# Patient Record
Sex: Female | Born: 1985 | Race: White | Hispanic: No | Smoking: Current some day smoker
Health system: Southern US, Community
[De-identification: ages and names within clinical notes are randomized; demographics above are authoritative.]

## PROBLEM LIST (undated history)

## (undated) ENCOUNTER — Inpatient Hospital Stay (HOSPITAL_COMMUNITY): Payer: Self-pay

## (undated) DIAGNOSIS — Z87738 Personal history of other specified (corrected) congenital malformations of digestive system: Secondary | ICD-10-CM

## (undated) DIAGNOSIS — J45909 Unspecified asthma, uncomplicated: Secondary | ICD-10-CM

## (undated) DIAGNOSIS — T7840XA Allergy, unspecified, initial encounter: Secondary | ICD-10-CM

## (undated) DIAGNOSIS — D649 Anemia, unspecified: Secondary | ICD-10-CM

## (undated) HISTORY — PX: GASTROSCHISIS CLOSURE: SHX1700

## (undated) HISTORY — DX: Personal history of other specified (corrected) congenital malformations of digestive system: Z87.738

## (undated) HISTORY — DX: Unspecified asthma, uncomplicated: J45.909

---

## 2018-12-02 LAB — OB RESULTS CONSOLE PLATELET COUNT: Platelets: 221

## 2018-12-02 LAB — HIV ANTIBODY (ROUTINE TESTING W REFLEX)

## 2018-12-02 LAB — OB RESULTS CONSOLE RUBELLA ANTIBODY, IGM: Rubella: IMMUNE

## 2018-12-02 LAB — OB RESULTS CONSOLE RPR: RPR: NONREACTIVE

## 2018-12-02 LAB — OB RESULTS CONSOLE HGB/HCT, BLOOD
HCT: 35 (ref 29–41)
Hemoglobin: 12.1

## 2018-12-02 LAB — OB RESULTS CONSOLE HEPATITIS B SURFACE ANTIGEN: Hepatitis B Surface Ag: NEGATIVE

## 2018-12-29 NOTE — L&D Delivery Note (Signed)
OB/GYN Faculty Practice Delivery Note  Peggy [name] is a 33 y.o. [id number] s/p SVD at [redacted]w[redacted]d. She was admitted for spontaneous onset of labor.   ROM: 3h 92m with clear fluid GBS Status: negative Maximum Maternal Temperature: Temp (24hrs), Avg:98 F (36.7 C), Min:97.5 F (36.4 C), Max:98.5 F (36.9 C)  Labor Progress: . Epidural placement . AROM clear fluid  Delivery Date/Time: 05/23/19 at 2221 Delivery: Called to room and patient was complete and pushing with fetal deceleration noted on monitor. Prepared for delivery and pushed with patient with next contraction as FHR had improved to 130s. Head delivered ROA. No nuchal cord present. Shoulder and body delivered in usual fashion. Infant with spontaneous cry, placed on mother's abdomen, dried and stimulated. Cord clamped x 2 after 1-minute delay, and cut by grandmother of baby. Cord blood drawn. Placenta delivered spontaneously with gentle cord traction. Fundus firm with massage and Pitocin. Labia, perineum, vagina, and cervix inspected inspected with no lacerations.   Placenta: spontaneous, intact, 3-vessel cord (to be discarded) Complications: fetal deceleration with pushing Lacerations: none EBL: 206cc per triton Analgesia: epidural  Postpartum Planning [x]  message to sent to schedule follow-up  [x]  vaccines UTD  Infant: Vigorous female  APGARs 8, 9  2906g (6lb 6.5oz)  Laurel S. Earlene Plater, DO OB/GYN Fellow, Faculty Practice

## 2018-12-31 ENCOUNTER — Other Ambulatory Visit: Payer: Self-pay

## 2018-12-31 ENCOUNTER — Encounter (HOSPITAL_COMMUNITY): Payer: Self-pay | Admitting: *Deleted

## 2018-12-31 ENCOUNTER — Inpatient Hospital Stay (HOSPITAL_COMMUNITY)
Admission: AD | Admit: 2018-12-31 | Discharge: 2018-12-31 | Disposition: A | Discharge status: Home / Self Care | Payer: Medicaid Other | Attending: Obstetrics & Gynecology | Admitting: Obstetrics & Gynecology

## 2018-12-31 DIAGNOSIS — Z0371 Encounter for suspected problem with amniotic cavity and membrane ruled out: Secondary | ICD-10-CM | POA: Diagnosis not present

## 2018-12-31 DIAGNOSIS — O09212 Supervision of pregnancy with history of pre-term labor, second trimester: Secondary | ICD-10-CM | POA: Diagnosis not present

## 2018-12-31 DIAGNOSIS — Z3A19 19 weeks gestation of pregnancy: Secondary | ICD-10-CM

## 2018-12-31 DIAGNOSIS — O09892 Supervision of other high risk pregnancies, second trimester: Secondary | ICD-10-CM

## 2018-12-31 DIAGNOSIS — R109 Unspecified abdominal pain: Secondary | ICD-10-CM

## 2018-12-31 DIAGNOSIS — O26892 Other specified pregnancy related conditions, second trimester: Secondary | ICD-10-CM | POA: Diagnosis not present

## 2018-12-31 DIAGNOSIS — Z87891 Personal history of nicotine dependence: Secondary | ICD-10-CM | POA: Insufficient documentation

## 2018-12-31 HISTORY — DX: Anemia, unspecified: D64.9

## 2018-12-31 LAB — AMNISURE RUPTURE OF MEMBRANE (ROM) NOT AT ARMC: Amnisure ROM: NEGATIVE

## 2018-12-31 MED ORDER — CETIRIZINE HCL 10 MG PO TABS: 10.0000 mg | ORAL_TABLET | Freq: Every day | ORAL | 5 refills | Status: DC

## 2018-12-31 MED ORDER — PRENATAL VITAMINS 0.8 MG PO TABS: 1.0000 | ORAL_TABLET | Freq: Every day | ORAL | 12 refills | Status: DC

## 2018-12-31 NOTE — MAU Provider Note (Addendum)
Chief Complaint:  Vaginal Discharge   First Provider Initiated Contact with Patient 12/31/18 1602      HPI: Peggy [name] is a 33 y.o. [phone] at [redacted]w[redacted]d who presents to maternity admissions reporting onset of leaking fluid this morning wetting 3 pairs of underwear.  She put on a pad after the leaking episodes but reports the pad has stayed dry. There is some mild bilateral intermittent cramping in her low abdomen associated with the leaking. There are no other symptoms. She has not tried any treatments.  She is feeling fetal movement.  HPI  Past Medical History: Past Medical History:  Diagnosis Date  . Anemia     Past obstetric history: OB History  Gravida Para Term Preterm AB Living  4 3   3   3   SAB TAB Ectopic Multiple Live Births          3    # Outcome Date GA Lbr Len/2nd Weight Sex Delivery Anes PTL Lv  4 Current           3 Preterm 09/25/17 [redacted]w[redacted]d       LIV  2 Preterm 02/28/12 [redacted]w[redacted]d   M    LIV  1 Preterm 11/27/05 [redacted]w[redacted]d   M Vag-Spont   LIV    Past Surgical History: The histories are not reviewed yet. Please review them in the "History" navigator section and refresh this SmartLink.  Family History: History reviewed. No pertinent family history.  Social History: Social History   Tobacco Use  . Smoking status: Former Smoker    Last attempt to quit: 08/31/2018    Years since quitting: 0.3  Substance Use Topics  . Alcohol use: Not Currently  . Drug use: Not Currently    Allergies: No Known Allergies  Meds:  No medications prior to admission.    ROS:  Review of Systems  Constitutional: Negative for chills, fatigue and fever.  Eyes: Negative for visual disturbance.  Respiratory: Negative for shortness of breath.   Cardiovascular: Negative for chest pain.  Gastrointestinal: Positive for abdominal pain. Negative for nausea and vomiting.  Genitourinary: Positive for pelvic pain and vaginal discharge. Negative for difficulty urinating, dysuria, flank pain, vaginal  bleeding and vaginal pain.  Neurological: Negative for dizziness and headaches.  Psychiatric/Behavioral: Negative.      I have reviewed patient's Past Medical Hx, Surgical Hx, Family Hx, Social Hx, medications and allergies.   Physical Exam   Patient Vitals for the past 24 hrs:  BP Temp Temp src Pulse Resp SpO2 Weight  12/31/18 1443 112/62 98.3 F (36.8 C) Oral 71 17 100 % 52.2 kg   Constitutional: Well-developed, well-nourished female in no acute distress.  Cardiovascular: normal rate Respiratory: normal effort GI: Abd soft, non-tender, gravid appropriate for gestational age.  MS: Extremities nontender, no edema, normal ROM Neurologic: Alert and oriented x 4.  GU: Neg CVAT.  PELVIC EXAM: Cervix pink, visually closed, without lesion, scant white creamy discharge, no pooling of fluid with valsalva, vaginal walls and external genitalia normal   Dilation: Closed Effacement (%): 50 Cervical Position: Posterior Station: Ballotable Exam by:: Leftwich-Kirby, CNM  FHT:  148 by doppler   Labs: Results for orders placed or performed during the hospital encounter of 12/31/18 (from the past 24 hour(s))  Amnisure rupture of membrane (rom)not at [facility]     Status: None   Collection Time: 12/31/18  3:58 PM  Result Value Ref Range   Amnisure ROM NEGATIVE       Imaging:  No results  found.  MAU Course/MDM: No pooling on SSE, amnisure pending Cervix 0/50/high on exam so no evidence of labor FHR wnl Pt transferring from [place], lives in [place].  Has appt at [facility] but wants to delivery at [facility] for high level NICU with her hx of preterm delivery x 3. Amnisure negative so no evidence of ROM Message sent to [facility] to establish care Outpatient anatomy US ordered Rx for PNV and Zyrtec Return to MAU as needed for emergencies Pt discharge with strict return precautions.   Assessment: 1. Encounter for suspected premature rupture of amniotic membranes, with rupture of  membranes not found   2. Abdominal pain during pregnancy, second trimester   3. [redacted] weeks gestation of pregnancy   4. History of preterm delivery, currently pregnant in second trimester     Plan: Discharge home Labor precautions and fetal kick counts Follow-up Information    Center for [facility] at [facility] Follow up.   Specialty:  Obstetrics and Gynecology Contact information: [address] [place] [address] [phone]         Allergies as of 12/31/2018   No Known Allergies     Medication List    TAKE these medications   acetaminophen 325 MG tablet Commonly known as:  TYLENOL Take 650 mg by mouth every 6 (six) hours as needed for moderate pain or headache.   albuterol 108 (90 Base) MCG/ACT inhaler Commonly known as:  PROVENTIL HFA;VENTOLIN HFA Inhale 1-2 puffs into the lungs every 6 (six) hours as needed for wheezing or shortness of breath.   cetirizine 10 MG tablet Commonly known as:  ZYRTEC Take 10 mg by mouth daily. What changed:  Another medication with the same name was added. Make sure you understand how and when to take each.   cetirizine 10 MG tablet Commonly known as:  ZYRTEC Take 1 tablet (10 mg total) by mouth daily. What changed:  You were already taking a medication with the same name, and this prescription was added. Make sure you understand how and when to take each.   Prenatal Vitamins 0.8 MG tablet Take 1 tablet by mouth daily.       Sharen Counter Certified Nurse-Midwife 12/31/2018 5:36 PM

## 2018-12-31 NOTE — MAU Note (Signed)
Been high risk with all her preg, all preterm deliveries.  Been leaking all morning, transferring from [place], last appt was 11/6. changed her underwear 3 times this morning.  Pain in RLQ, started around 0500.

## 2019-01-06 ENCOUNTER — Encounter: Payer: Self-pay | Admitting: Obstetrics and Gynecology

## 2019-01-06 ENCOUNTER — Encounter: Payer: Self-pay | Admitting: *Deleted

## 2019-01-06 ENCOUNTER — Ambulatory Visit (INDEPENDENT_AMBULATORY_CARE_PROVIDER_SITE_OTHER): Payer: Medicaid Other | Admitting: Obstetrics and Gynecology

## 2019-01-06 ENCOUNTER — Other Ambulatory Visit (HOSPITAL_COMMUNITY)
Admission: RE | Admit: 2019-01-06 | Discharge: 2019-01-06 | Disposition: A | Discharge status: Home / Self Care | Payer: Medicaid Other | Source: Ambulatory Visit | Attending: Obstetrics and Gynecology | Admitting: Obstetrics and Gynecology

## 2019-01-06 ENCOUNTER — Encounter: Payer: Self-pay | Admitting: Radiology

## 2019-01-06 VITALS — BP 99/65 | HR 82 | Ht 59.0 in | Wt 119.0 lb

## 2019-01-06 DIAGNOSIS — O09219 Supervision of pregnancy with history of pre-term labor, unspecified trimester: Principal | ICD-10-CM

## 2019-01-06 DIAGNOSIS — O09212 Supervision of pregnancy with history of pre-term labor, second trimester: Secondary | ICD-10-CM

## 2019-01-06 DIAGNOSIS — Z3A19 19 weeks gestation of pregnancy: Secondary | ICD-10-CM

## 2019-01-06 DIAGNOSIS — O0992 Supervision of high risk pregnancy, unspecified, second trimester: Secondary | ICD-10-CM | POA: Diagnosis not present

## 2019-01-06 DIAGNOSIS — Z87761 Personal history of (corrected) gastroschisis: Secondary | ICD-10-CM

## 2019-01-06 DIAGNOSIS — O0990 Supervision of high risk pregnancy, unspecified, unspecified trimester: Secondary | ICD-10-CM | POA: Insufficient documentation

## 2019-01-06 DIAGNOSIS — O09899 Supervision of other high risk pregnancies, unspecified trimester: Secondary | ICD-10-CM

## 2019-01-06 DIAGNOSIS — Z3482 Encounter for supervision of other normal pregnancy, second trimester: Secondary | ICD-10-CM | POA: Diagnosis not present

## 2019-01-06 DIAGNOSIS — Z87738 Personal history of other specified (corrected) congenital malformations of digestive system: Secondary | ICD-10-CM | POA: Diagnosis not present

## 2019-01-06 HISTORY — DX: Personal history of other specified (corrected) congenital malformations of digestive system: Z87.738

## 2019-01-06 HISTORY — DX: Personal history of (corrected) gastroschisis: Z87.761

## 2019-01-06 MED ORDER — HYDROXYPROGESTERONE CAPROATE 275 MG/1.1ML ~~LOC~~ SOAJ
275.0000 mg | Freq: Once | SUBCUTANEOUS | Status: AC
  Administered 2019-01-06: 275 mg via SUBCUTANEOUS

## 2019-01-06 NOTE — Progress Notes (Signed)
New OB Note  01/06/2019   Clinic: Center for [facility]  Chief Complaint: transfer of care Peggy [name], Peggy [name])  History of Present Illness: Peggy [name] is a 33 y.o. [phone] @ 19/3 weeks (EDC 6/1 per patient report). Patient states first u/s was at 6wks.  Patient's last menstrual period was 09/02/2018.) but not certain this is correct Preg complicated by has Supervision of high risk pregnancy, antepartum; History of preterm delivery, currently pregnant; and History of gastroschisis on their problem list.   Last visit with them was on 12/5  She was using oral contraceptives (estrogen/progesterone) when she conceived.  She has Negative signs or symptoms of miscarriage or preterm labor  ROS: A 12-point review of systems was performed and negative, except as stated in the above HPI.  OBGYN History: As per HPI. OB History  Gravida Para Term Preterm AB Living  4 3   3   3   SAB TAB Ectopic Multiple Live Births          3    # Outcome Date GA Lbr Len/2nd Weight Sex Delivery Anes PTL Lv  4 Current           3 Preterm 09/25/17 [redacted]w[redacted]d  5 lb 8 oz (2.495 kg)     LIV  2 Preterm 02/28/12 [redacted]w[redacted]d  6 lb 14 oz (3.118 kg) M    LIV  1 Preterm 11/27/05 [redacted]w[redacted]d   M Vag-Spont   LIV    Any issues with any prior pregnancies: PTB x 3. Was on 17p for last two. Patient states all were spontaneous PTL Prior children are healthy, doing well, and without any problems or issues: none for last two History of pap smears: Yes. Last pap smear 2019 and results were negative per patient report   Past Medical History: Past Medical History:  Diagnosis Date  . Anemia   . History of gastroschisis 01/06/2019    Past Surgical History: Past Surgical History:  Procedure Laterality Date  . GASTROSCHISIS CLOSURE      Family History:  History reviewed. No pertinent family history.   Social History:  Social History   Socioeconomic History  . Marital status: Widowed    Spouse name: Not on file  . Number of  children: Not on file  . Years of education: Not on file  . Highest education level: Not on file  Occupational History  . Not on file  Social Needs  . Financial resource strain: Not on file  . Food insecurity:    Worry: Not on file    Inability: Not on file  . Transportation needs:    Medical: Not on file    Non-medical: Not on file  Tobacco Use  . Smoking status: Former Smoker    Last attempt to quit: 08/31/2018    Years since quitting: 0.3  . Smokeless tobacco: Never Used  Substance and Sexual Activity  . Alcohol use: Not Currently  . Drug use: Not Currently  . Sexual activity: Not Currently  Lifestyle  . Physical activity:    Days per week: Not on file    Minutes per session: Not on file  . Stress: Not on file  Relationships  . Social connections:    Talks on phone: Not on file    Gets together: Not on file    Attends religious service: Not on file    Active member of club or organization: Not on file    Attends meetings of clubs or organizations: Not on file  Relationship status: Not on file  . Intimate partner violence:    Fear of current or ex partner: Not on file    Emotionally abused: Not on file    Physically abused: Not on file    Forced sexual activity: Not on file  Other Topics Concern  . Not on file  Social History Narrative  . Not on file     Allergy: No Known Allergies  Health Maintenance:  Mammogram Up to Date: not applicable  Current Outpatient Medications: PNV  Physical Exam:   BP 99/65   Pulse 82   Ht 4\' 11"  (1.499 m)   Wt 119 lb (54 kg)   LMP 09/02/2018   BMI 24.04 kg/m  Body mass index is 24.04 kg/m. Contractions: Irritability Vag. Bleeding: None. Fundal height: 20 FHTs: 140s  General appearance: Well nourished, well developed female in no acute distress.  Neck:  Supple, normal appearance, and no thyromegaly  Cardiovascular: S1, S2 normal, no murmur, rub or gallop, regular rate and rhythm Respiratory:  Clear to  auscultation bilateral. Normal respiratory effort Abdomen: positive bowel sounds and no masses, hernias; diffusely non tender to palpation, non distended. Gravid. Well healed upper abdomen transverse scar Neuro/Psych:  Normal mood and affect.  Skin:  Warm and dry.  Lymphatic:  No inguinal lymphadenopathy.   Pelvic exam: is not limited by body habitus EGBUS: within normal limits, Vagina: within normal limits and with no blood in the vault, Cervix: normal appearing cervix without discharge or lesions, closed/long/high, Uterus:  enlarged, c/w 20 week size, and Adnexa:  normal adnexa and no mass, fullness, tenderness  Laboratory: Awaiting records. Patient pulled up her portal and showed us some.  Imaging:  none  Assessment: pt stable  Plan: 1. Supervision of high risk pregnancy, antepartum Routine care. Set up for asap anatomy u/s. Will get rest of prenatal labs which she states she has with them back to 2013. Pt amenable to genetics. Follow up if records received at [facility]. Confirm edc once records received. Offer carrier screen if not already done from prior pregnancies.  - US MFM OB Transvaginal; Future - Cervicovaginal ancillary only( [place])  2. History of preterm delivery, currently pregnant Amenable to start 17p today. Will do cx length with anatomy u/s. Exam negative today.  - US MFM OB Transvaginal; Future  Problem list reviewed and updated.  Follow up in 1 weeks.  >50% of 25 min visit spent on counseling and coordination of care.     Cornelia Copaharlie Angalena Cousineau, Jr. MD Attending [facility](Faculty Practice)

## 2019-01-06 NOTE — Progress Notes (Signed)
Last pap October 18, 2018 was normal   Last prenatal visit was 12/5

## 2019-01-07 LAB — CERVICOVAGINAL ANCILLARY ONLY
Chlamydia: NEGATIVE
Neisseria Gonorrhea: NEGATIVE
Trichomonas: NEGATIVE

## 2019-01-07 LAB — ABO AND RH: Rh Factor: POSITIVE

## 2019-01-07 LAB — ANTIBODY SCREEN: Antibody Screen: NEGATIVE

## 2019-01-07 LAB — HEPATITIS C ANTIBODY: Hep C Virus Ab: <0.1 s/co ratio (ref 0.0–0.9)

## 2019-01-07 LAB — HIV ANTIBODY (ROUTINE TESTING W REFLEX): HIV Screen 4th Generation wRfx: NONREACTIVE

## 2019-01-08 LAB — AFP, SERUM, OPEN SPINA BIFIDA
AFP MoM: 0.76
AFP Value: 45.5 ng/mL
Gest. Age on Collection Date: 19.3 weeks
Maternal Age At EDD: 33.3 yr
OSBR Risk 1 IN: 10000
Test Results:: NEGATIVE
Weight: 119 [lb_av]

## 2019-01-11 ENCOUNTER — Encounter: Payer: Medicaid Other | Admitting: Obstetrics and Gynecology

## 2019-01-11 ENCOUNTER — Encounter: Payer: Self-pay | Admitting: Radiology

## 2019-01-12 ENCOUNTER — Encounter (HOSPITAL_COMMUNITY): Payer: Self-pay

## 2019-01-12 ENCOUNTER — Ambulatory Visit (HOSPITAL_COMMUNITY)
Admission: RE | Admit: 2019-01-12 | Discharge: 2019-01-12 | Disposition: A | Discharge status: Home / Self Care | Payer: Medicaid Other | Source: Ambulatory Visit | Attending: Advanced Practice Midwife | Admitting: Advanced Practice Midwife

## 2019-01-12 DIAGNOSIS — Z3A19 19 weeks gestation of pregnancy: Secondary | ICD-10-CM

## 2019-01-12 DIAGNOSIS — O09212 Supervision of pregnancy with history of pre-term labor, second trimester: Secondary | ICD-10-CM | POA: Diagnosis not present

## 2019-01-12 DIAGNOSIS — O09899 Supervision of other high risk pregnancies, unspecified trimester: Secondary | ICD-10-CM

## 2019-01-12 DIAGNOSIS — Z363 Encounter for antenatal screening for malformations: Secondary | ICD-10-CM | POA: Diagnosis not present

## 2019-01-12 DIAGNOSIS — O0990 Supervision of high risk pregnancy, unspecified, unspecified trimester: Secondary | ICD-10-CM | POA: Diagnosis not present

## 2019-01-12 DIAGNOSIS — O09892 Supervision of other high risk pregnancies, second trimester: Secondary | ICD-10-CM

## 2019-01-12 DIAGNOSIS — O09219 Supervision of pregnancy with history of pre-term labor, unspecified trimester: Secondary | ICD-10-CM | POA: Insufficient documentation

## 2019-01-13 ENCOUNTER — Ambulatory Visit (INDEPENDENT_AMBULATORY_CARE_PROVIDER_SITE_OTHER): Payer: Medicaid Other | Admitting: *Deleted

## 2019-01-13 ENCOUNTER — Other Ambulatory Visit: Payer: Self-pay | Admitting: Obstetrics and Gynecology

## 2019-01-13 ENCOUNTER — Encounter: Payer: Self-pay | Admitting: Radiology

## 2019-01-13 ENCOUNTER — Other Ambulatory Visit (HOSPITAL_COMMUNITY): Payer: Self-pay | Admitting: *Deleted

## 2019-01-13 VITALS — BP 100/64 | HR 82

## 2019-01-13 DIAGNOSIS — O09219 Supervision of pregnancy with history of pre-term labor, unspecified trimester: Principal | ICD-10-CM

## 2019-01-13 DIAGNOSIS — O99513 Diseases of the respiratory system complicating pregnancy, third trimester: Principal | ICD-10-CM

## 2019-01-13 DIAGNOSIS — J45909 Unspecified asthma, uncomplicated: Secondary | ICD-10-CM | POA: Insufficient documentation

## 2019-01-13 DIAGNOSIS — O09899 Supervision of other high risk pregnancies, unspecified trimester: Secondary | ICD-10-CM

## 2019-01-13 DIAGNOSIS — O09212 Supervision of pregnancy with history of pre-term labor, second trimester: Secondary | ICD-10-CM

## 2019-01-13 MED ORDER — HYDROXYPROGESTERONE CAPROATE 275 MG/1.1ML ~~LOC~~ SOAJ
275.0000 mg | Freq: Once | SUBCUTANEOUS | Status: AC
  Administered 2019-01-13: 275 mg via SUBCUTANEOUS

## 2019-01-13 MED ORDER — BUDESONIDE 180 MCG/ACT IN AEPB: 2.0000 | INHALATION_SPRAY | Freq: Two times a day (BID) | RESPIRATORY_TRACT | 2 refills | Status: DC

## 2019-01-13 NOTE — Addendum Note (Signed)
Addended by: Scheryl Marten on: 01/13/2019 10:30 AM   Modules accepted: Orders

## 2019-01-13 NOTE — Progress Notes (Signed)
Pt here for 17p injection. Pt denies any issues with previous injection. Has no complaints at this time. Pt tolerated injection well.   Scheryl Martenhristine Soliz, RN

## 2019-01-13 NOTE — Progress Notes (Signed)
Patient seen and assessed by nursing staff.  Agree with documentation and plan.  

## 2019-01-14 ENCOUNTER — Telehealth: Payer: Self-pay | Admitting: *Deleted

## 2019-01-14 ENCOUNTER — Encounter: Payer: Self-pay | Admitting: Radiology

## 2019-01-14 NOTE — Telephone Encounter (Signed)
-----   Message from Shaniko Bing, MD sent at 01/13/2019  8:56 PM EST ----- Regarding: RE: refill for albuteral I sent in pulmicort 2 puffs bid scheduled. Please let her know and that if she is using her albuterol more than 2-3x/week to let us know ----- Message ----- From: Clovis Riley, RN Sent: 01/13/2019   2:03 PM EST To: Armona Bing, MD Subject: FW: refill for albuteral                       She said right now she uses it 1-2 times a day, states she is worse with pregnancy and has only had albuterol. Also told her about needing to get a PCP.  Mercia Dowe    ----- Message ----- From: [place] Bing, MD Sent: 01/13/2019   1:58 PM EST To: Clovis Riley, RN Subject: RE: refill for albuteral                       That's fine. Ask her how often per week she uses it. If she uses it more than 2-3x/week let me know. She'll need a maintenance inhaler then. Thanks! ----- Message ----- From: Clovis Riley, RN Sent: 01/13/2019  11:36 AM EST To: [place] Bing, MD Subject: FW: refill for albuteral                       I know that she will need to see a PCP but wasn't sure if we could refill until she gets in with a PCP?  Thanks  Magazine features editor ----- Message ----- From: Rozann Lesches, NT Sent: 01/13/2019  10:36 AM EST To: [facility] [place] [facility] Subject: refill for albuteral                           Please send a refill for albuterol to [facility] in [place]

## 2019-01-14 NOTE — Telephone Encounter (Signed)
Left message that RX was sent in and let us know if she is still using her albuterol more than 2-3 x a week. To call us back if she has any questions.   Scheryl Marten, RN

## 2019-01-20 ENCOUNTER — Ambulatory Visit (INDEPENDENT_AMBULATORY_CARE_PROVIDER_SITE_OTHER): Payer: Medicaid Other | Admitting: *Deleted

## 2019-01-20 VITALS — BP 105/67 | HR 93

## 2019-01-20 DIAGNOSIS — O09212 Supervision of pregnancy with history of pre-term labor, second trimester: Secondary | ICD-10-CM | POA: Diagnosis not present

## 2019-01-20 DIAGNOSIS — O0990 Supervision of high risk pregnancy, unspecified, unspecified trimester: Secondary | ICD-10-CM

## 2019-01-20 MED ORDER — HYDROXYPROGESTERONE CAPROATE 275 MG/1.1ML ~~LOC~~ SOAJ
275.0000 mg | Freq: Once | SUBCUTANEOUS | Status: AC
  Administered 2019-01-20: 275 mg via SUBCUTANEOUS

## 2019-01-20 NOTE — Progress Notes (Signed)
I have reviewed the chart and agree with nursing staff's documentation of this patient's encounter.  Jaynie CollinsUgonna Janyra Barillas, MD 01/20/2019 12:06 PM

## 2019-01-20 NOTE — Progress Notes (Signed)
Pt here today for 17p. Denies any issues or complaints at this time. Pt tolerated injection well. Follow up next week.   Scheryl Marten, RN

## 2019-01-22 ENCOUNTER — Encounter (HOSPITAL_COMMUNITY): Payer: Self-pay | Admitting: Emergency Medicine

## 2019-01-22 ENCOUNTER — Emergency Department (HOSPITAL_COMMUNITY)
Admission: EM | Admit: 2019-01-22 | Discharge: 2019-01-22 | Disposition: A | Discharge status: Home / Self Care | Payer: Medicaid Other | Attending: Emergency Medicine | Admitting: Emergency Medicine

## 2019-01-22 ENCOUNTER — Other Ambulatory Visit: Payer: Self-pay

## 2019-01-22 DIAGNOSIS — Z5321 Procedure and treatment not carried out due to patient leaving prior to being seen by health care provider: Secondary | ICD-10-CM | POA: Diagnosis not present

## 2019-01-22 DIAGNOSIS — Z3A20 20 weeks gestation of pregnancy: Secondary | ICD-10-CM | POA: Diagnosis not present

## 2019-01-22 DIAGNOSIS — O219 Vomiting of pregnancy, unspecified: Secondary | ICD-10-CM | POA: Insufficient documentation

## 2019-01-22 HISTORY — DX: Allergy, unspecified, initial encounter: T78.40XA

## 2019-01-22 LAB — URINALYSIS, ROUTINE W REFLEX MICROSCOPIC
Bilirubin Urine: NEGATIVE
Glucose, UA: NEGATIVE mg/dL
Hgb urine dipstick: NEGATIVE
Ketones, ur: NEGATIVE mg/dL
LEUKOCYTES UA: NEGATIVE
Nitrite: NEGATIVE
Protein, ur: NEGATIVE mg/dL
Specific Gravity, Urine: 1.019 (ref 1.005–1.030)
pH: 6 (ref 5.0–8.0)

## 2019-01-22 LAB — COMPREHENSIVE METABOLIC PANEL
ALT: 8 U/L (ref 0–44)
AST: 17 U/L (ref 15–41)
Albumin: 3.3 g/dL — ABNORMAL LOW (ref 3.5–5.0)
Alkaline Phosphatase: 47 U/L (ref 38–126)
Anion gap: 8 (ref 5–15)
BUN: 7 mg/dL (ref 6–20)
CO2: 22 mmol/L (ref 22–32)
Calcium: 8.6 mg/dL — ABNORMAL LOW (ref 8.9–10.3)
Chloride: 106 mmol/L (ref 98–111)
Creatinine, Ser: 0.56 mg/dL (ref 0.44–1.00)
GFR calc Af Amer: >60 mL/min (ref >60)
GFR calc non Af Amer: >60 mL/min (ref >60)
Glucose, Bld: 92 mg/dL (ref 70–99)
Potassium: 3.7 mmol/L (ref 3.5–5.1)
SODIUM: 136 mmol/L (ref 135–145)
Total Bilirubin: 0.5 mg/dL (ref 0.3–1.2)
Total Protein: 6.4 g/dL — ABNORMAL LOW (ref 6.5–8.1)

## 2019-01-22 LAB — CBC
HCT: 35.2 % — ABNORMAL LOW (ref 36.0–46.0)
HEMOGLOBIN: 11.4 g/dL — AB (ref 12.0–15.0)
MCH: 30.2 pg (ref 26.0–34.0)
MCHC: 32.4 g/dL (ref 30.0–36.0)
MCV: 93.4 fL (ref 80.0–100.0)
Platelets: 170 10*3/uL (ref 150–400)
RBC: 3.77 MIL/uL — ABNORMAL LOW (ref 3.87–5.11)
RDW: 13.6 % (ref 11.5–15.5)
WBC: 11.3 10*3/uL — ABNORMAL HIGH (ref 4.0–10.5)
nRBC: 0 % (ref 0.0–0.2)

## 2019-01-22 LAB — LIPASE, BLOOD: LIPASE: 32 U/L (ref 11–51)

## 2019-01-22 NOTE — ED Notes (Signed)
Patient left AMA and states she will go to urgent care in am

## 2019-01-22 NOTE — ED Triage Notes (Signed)
Pt is [redacted] weeks pregnant.  C/o vomiting since 12:15am.  States she ate lobster at 11pm.  Denies abd pain and diarrhea.

## 2019-01-26 ENCOUNTER — Encounter (HOSPITAL_COMMUNITY): Payer: Self-pay

## 2019-01-26 ENCOUNTER — Ambulatory Visit (HOSPITAL_COMMUNITY)
Admission: RE | Admit: 2019-01-26 | Discharge: 2019-01-26 | Disposition: A | Discharge status: Home / Self Care | Payer: Medicaid Other | Source: Ambulatory Visit | Attending: Advanced Practice Midwife | Admitting: Advanced Practice Midwife

## 2019-01-26 DIAGNOSIS — Z363 Encounter for antenatal screening for malformations: Secondary | ICD-10-CM | POA: Diagnosis not present

## 2019-01-26 DIAGNOSIS — Z3A22 22 weeks gestation of pregnancy: Secondary | ICD-10-CM

## 2019-01-26 DIAGNOSIS — O09212 Supervision of pregnancy with history of pre-term labor, second trimester: Secondary | ICD-10-CM

## 2019-01-26 DIAGNOSIS — O09219 Supervision of pregnancy with history of pre-term labor, unspecified trimester: Secondary | ICD-10-CM | POA: Diagnosis not present

## 2019-01-27 ENCOUNTER — Ambulatory Visit (INDEPENDENT_AMBULATORY_CARE_PROVIDER_SITE_OTHER): Payer: Medicaid Other | Admitting: *Deleted

## 2019-01-27 VITALS — BP 98/61 | HR 89 | Wt 123.0 lb

## 2019-01-27 DIAGNOSIS — O0990 Supervision of high risk pregnancy, unspecified, unspecified trimester: Secondary | ICD-10-CM

## 2019-01-27 DIAGNOSIS — O09212 Supervision of pregnancy with history of pre-term labor, second trimester: Secondary | ICD-10-CM

## 2019-01-27 MED ORDER — HYDROXYPROGESTERONE CAPROATE 275 MG/1.1ML ~~LOC~~ SOAJ
275.0000 mg | Freq: Once | SUBCUTANEOUS | Status: AC
  Administered 2019-01-27: 275 mg via SUBCUTANEOUS

## 2019-01-27 NOTE — Progress Notes (Signed)
Pt here for 17p, denies any issue or complaints at this time. Pt tolerated injection well. To follow up next week.

## 2019-01-27 NOTE — Progress Notes (Signed)
I have reviewed the chart and agree with nursing staff's documentation of this patient's encounter.  Peggy CollinsUgonna Vernice Bowker, MD 01/27/2019 11:39 AM

## 2019-02-02 ENCOUNTER — Ambulatory Visit (INDEPENDENT_AMBULATORY_CARE_PROVIDER_SITE_OTHER): Payer: Medicaid Other | Admitting: Advanced Practice Midwife

## 2019-02-02 VITALS — BP 119/71 | HR 91 | Wt 124.0 lb

## 2019-02-02 DIAGNOSIS — O0992 Supervision of high risk pregnancy, unspecified, second trimester: Secondary | ICD-10-CM

## 2019-02-02 DIAGNOSIS — O09219 Supervision of pregnancy with history of pre-term labor, unspecified trimester: Secondary | ICD-10-CM

## 2019-02-02 DIAGNOSIS — O09212 Supervision of pregnancy with history of pre-term labor, second trimester: Secondary | ICD-10-CM

## 2019-02-02 DIAGNOSIS — O09899 Supervision of other high risk pregnancies, unspecified trimester: Secondary | ICD-10-CM

## 2019-02-02 DIAGNOSIS — O0990 Supervision of high risk pregnancy, unspecified, unspecified trimester: Secondary | ICD-10-CM

## 2019-02-02 MED ORDER — HYDROXYPROGESTERONE CAPROATE 275 MG/1.1ML ~~LOC~~ SOAJ
275.0000 mg | Freq: Once | SUBCUTANEOUS | Status: AC
  Administered 2019-02-02: 275 mg via SUBCUTANEOUS

## 2019-02-02 NOTE — Progress Notes (Signed)
   PRENATAL VISIT NOTE  Subjective:  Peggy [name] is a 33 y.o. [phone] at [redacted]w[redacted]d being seen today for ongoing prenatal care.  She is currently monitored for the following issues for this high-risk pregnancy and has Supervision of high risk pregnancy, antepartum; History of preterm delivery, currently pregnant; History of gastroschisis; and Asthma affecting pregnancy in third trimester on their problem list.  Patient reports no complaints.  Contractions: Irritability. Vag. Bleeding: None.  Movement: Present. Denies leaking of fluid.   The following portions of the patient's history were reviewed and updated as appropriate: allergies, current medications, past family history, past medical history, past social history, past surgical history and problem list. Problem list updated.  Objective:   Vitals:   02/02/19 1038  BP: 119/71  Pulse: 91  Weight: 124 lb (56.2 kg)    Fetal Status: Fetal Heart Rate (bpm): 155 Fundal Height: 23 cm Movement: Present     General:  Alert, oriented and cooperative. Patient is in no acute distress.  Skin: Skin is warm and dry. No rash noted.   Cardiovascular: Normal heart rate noted  Respiratory: Normal respiratory effort, no problems with respiration noted  Abdomen: Soft, gravid, appropriate for gestational age.  Pain/Pressure: Present     Pelvic: Cervical exam deferred        Extremities: Normal range of motion.  Edema: Trace  Mental Status: Normal mood and affect. Normal behavior. Normal judgment and thought content.   Assessment and Plan:  Pregnancy: [id number] at [redacted]w[redacted]d  1. Supervision of high risk pregnancy, antepartum  --Normal cervical length (3.6 cm) per MFM 01/26/2019 --Patient desires water birth. Reviewed exclusion criteria. Risks and benefits reviewed and restated in AVS --Fasting GTT and 3rd trimester labs next visit  2. History of preterm delivery, currently pregnant --Continue weekly 17P  Preterm labor symptoms and general obstetric  precautions including but not limited to vaginal bleeding, contractions, leaking of fluid and fetal movement were reviewed in detail with the patient. Please refer to After Visit Summary for other counseling recommendations.  Return in about 4 weeks (around 03/02/2019).  Future Appointments  Date Time Provider Department Center  02/10/2019 10:30 AM [facility] NURSE [facility]  02/17/2019 10:30 AM [facility] NURSE [facility]  02/24/2019 10:30 AM [facility] NURSE [facility]    Calvert Cantor, CNM

## 2019-02-02 NOTE — Patient Instructions (Addendum)
Third Trimester of Pregnancy  The third trimester is from week 28 through week 40 (months 7 through 9). This trimester is when your unborn baby (fetus) is growing very fast. At the end of the ninth month, the unborn baby is about 20 inches in length. It weighs about 6-10 pounds. Follow these instructions at home: Medicines  Take over-the-counter and prescription medicines only as told by your doctor. Some medicines are safe and some medicines are not safe during pregnancy.  Take a prenatal vitamin that contains at least 600 micrograms (mcg) of folic acid.  If you have trouble pooping (constipation), take medicine that will make your stool soft (stool softener) if your doctor approves. Eating and drinking   Eat regular, healthy meals.  Avoid raw meat and uncooked cheese.  If you get low calcium from the food you eat, talk to your doctor about taking a daily calcium supplement.  Eat four or five small meals rather than three large meals a day.  Avoid foods that are high in fat and sugars, such as fried and sweet foods.  To prevent constipation: ? Eat foods that are high in fiber, like fresh fruits and vegetables, whole grains, and beans. ? Drink enough fluids to keep your pee (urine) clear or pale yellow. Activity  Exercise only as told by your doctor. Stop exercising if you start to have cramps.  Avoid heavy lifting, wear low heels, and sit up straight.  Do not exercise if it is too hot, too humid, or if you are in a place of great height (high altitude).  You may continue to have sex unless your doctor tells you not to. Relieving pain and discomfort  Wear a good support bra if your breasts are tender.  Take frequent breaks and rest with your legs raised if you have leg cramps or low back pain.  Take warm water baths (sitz baths) to soothe pain or discomfort caused by hemorrhoids. Use hemorrhoid cream if your doctor approves.  If you develop puffy, bulging veins (varicose  veins) in your legs: ? Wear support hose or compression stockings as told by your doctor. ? Raise (elevate) your feet for 15 minutes, 3-4 times a day. ? Limit salt in your food. Safety  Wear your seat belt when driving.  Make a list of emergency phone numbers, including numbers for family, friends, the hospital, and police and fire departments. Preparing for your baby's arrival To prepare for the arrival of your baby:  Take prenatal classes.  Practice driving to the hospital.  Visit the hospital and tour the maternity area.  Talk to your work about taking leave once the baby comes.  Pack your hospital bag.  Prepare the baby's room.  Go to your doctor visits.  Buy a rear-facing car seat. Learn how to install it in your car. General instructions  Do not use hot tubs, steam rooms, or saunas.  Do not use any products that contain nicotine or tobacco, such as cigarettes and e-cigarettes. If you need help quitting, ask your doctor.  Do not drink alcohol.  Do not douche or use tampons or scented sanitary pads.  Do not cross your legs for long periods of time.  Do not travel for long distances unless you must. Only do so if your doctor says it is okay.  Visit your dentist if you have not gone during your pregnancy. Use a soft toothbrush to brush your teeth. Be gentle when you floss.  Avoid cat litter boxes and soil  used by cats. These carry germs that can cause birth defects in the baby and can cause a loss of your baby (miscarriage) or stillbirth.  Keep all your prenatal visits as told by your doctor. This is important. Contact a doctor if:  You are not sure if you are in labor or if your water has broken.  You are dizzy.  You have mild cramps or pressure in your lower belly.  You have a nagging pain in your belly area.  You continue to feel sick to your stomach, you throw up, or you have watery poop.  You have bad smelling fluid coming from your vagina.  You have  pain when you pee. Get help right away if:  You have a fever.  You are leaking fluid from your vagina.  You are spotting or bleeding from your vagina.  You have severe belly cramps or pain.  You lose or gain weight quickly.  You have trouble catching your breath and have chest pain.  You notice sudden or extreme puffiness (swelling) of your face, hands, ankles, feet, or legs.  You have not felt the baby move in over an hour.  You have severe headaches that do not go away with medicine.  You have trouble seeing.  You are leaking, or you are having a gush of fluid, from your vagina before you are 37 weeks.  You have regular belly spasms (contractions) before you are 37 weeks. Summary  The third trimester is from week 28 through week 40 (months 7 through 9). This time is when your unborn baby is growing very fast.  Follow your doctor's advice about medicine, food, and activity.  Get ready for the arrival of your baby by taking prenatal classes, getting all the baby items ready, preparing the baby's room, and visiting your doctor to be checked.  Get help right away if you are bleeding from your vagina, or you have chest pain and trouble catching your breath, or if you have not felt your baby move in over an hour. This information is not intended to replace advice given to you by your health care provider. Make sure you discuss any questions you have with your health care provider. Document Released: 03/11/2010 Document Revised: 01/20/2017 Document Reviewed: 01/20/2017 Elsevier Interactive Patient Education  2019 [place]? Guide for patients at [facility] [employer]  Why consider waterbirth?  . Gentle birth for babies . Less pain medicine used in labor . May allow for passive descent/less pushing . May reduce perineal tears  . More mobility and instinctive maternal position changes . Increased maternal relaxation . Reduced blood  pressure in labor  Is waterbirth safe? What are the risks of infection, drowning or other complications?  . Infection: o Very low risk (3.7 % for tub vs 4.8% for bed) o 7 in 8000 waterbirths with documented infection o Poorly cleaned equipment most common cause o Slightly lower group B strep transmission rate  . Drowning o Maternal:  - Very low risk   - Related to seizures or fainting o Newborn:  - Very low risk. No evidence of increased risk of respiratory problems in multiple large studies - Physiological protection from breathing under water - Avoid underwater birth if there are any fetal complications - Once baby's head is out of the water, keep it out.  . Birth complication o Some reports of cord trauma, but risk decreased by bringing baby to surface gradually o No evidence of increased risk  of shoulder dystocia. Mothers can usually change positions faster in water than in a bed, possibly aiding the maneuvers to free the shoulder.   You must attend a Doren Custard class at [facility]  3rd Wednesday of every month from 7-9pm  [employer] by calling [phone] or online at [email or web address]  Bring [place] the certificate from the class to your prenatal appointment  Meet with a midwife at 36 weeks to see if you can still plan a waterbirth and to sign the consent.   If you plan a waterbirth after February 20, 2019, at [facility], you will need to purchase the following:  Fish net  Bathing suit top (optional)  Long-handled mirror (optional)  If you plan a waterbirth before February 20, 2019: Purchase or rent the following supplies: You are responsible for providing all supplies listed above. **If you do not have all necessary supplies you cannot have a waterbirth.**   Water Birth Pool (Birth Pool in a Box or [place] for instance)  (Tubs start ~$125)  Single-use disposable tub liner designed for your brand of  tub  Electric drain pump to remove water (We recommend 792 gallon per hour or greater pump.)   New garden hose labeled "lead-free", "suitable for drinking water",  Separate garden hose to remove the dirty water  Fish net  Bathing suit top (optional)  Long-handled mirror (optional)  Places to purchase or rent supplies:   [employer] for tub purchases and supplies  [employer]com for tub purchases and supplies  [employer] (www.thelaborladies.com) $275 for tub rental/set-up & take down/kit   [employer] (http://www.fleming.com/.htm) Information regarding doulas (labor support) who provide pool rentals  Things that would prevent you from having a waterbirth:  Premature, <37wks  Previous cesarean birth  Presence of thick meconium-stained fluid  Multiple gestation (Twins, triplets, etc.)  Uncontrolled diabetes or gestational diabetes requiring medication  Hypertension requiring medication or diagnosis of pre-eclampsia  Heavy vaginal bleeding  Non-reassuring fetal heart rate  Active infection (MRSA, etc.). Group B Strep is NOT a contraindication for waterbirth.  If your labor has to be induced and induction method requires continuous monitoring of the baby's heart rate  Other risks/issues identified by your obstetrical provider  Please remember that birth is unpredictable. Under certain unforeseeable circumstances your provider may advise against giving birth in the tub. These decisions will be made on a case-by-case basis and with the safety of you and your baby as our highest priority.

## 2019-02-03 ENCOUNTER — Encounter: Payer: Medicaid Other | Admitting: Obstetrics & Gynecology

## 2019-02-10 ENCOUNTER — Ambulatory Visit (INDEPENDENT_AMBULATORY_CARE_PROVIDER_SITE_OTHER): Payer: Medicaid Other | Admitting: *Deleted

## 2019-02-10 VITALS — BP 120/74 | HR 91

## 2019-02-10 DIAGNOSIS — O09899 Supervision of other high risk pregnancies, unspecified trimester: Secondary | ICD-10-CM

## 2019-02-10 DIAGNOSIS — O09212 Supervision of pregnancy with history of pre-term labor, second trimester: Secondary | ICD-10-CM

## 2019-02-10 DIAGNOSIS — O09219 Supervision of pregnancy with history of pre-term labor, unspecified trimester: Principal | ICD-10-CM

## 2019-02-10 MED ORDER — HYDROXYPROGESTERONE CAPROATE 275 MG/1.1ML ~~LOC~~ SOAJ
275.0000 mg | Freq: Once | SUBCUTANEOUS | Status: AC
  Administered 2019-02-10: 275 mg via SUBCUTANEOUS

## 2019-02-10 NOTE — Progress Notes (Signed)
Pt here for 17p injection. Denies and complaints or issues at this time. Pt tolerated injection well and will follow up next week.   Scheryl Marten, RN

## 2019-02-17 ENCOUNTER — Ambulatory Visit (INDEPENDENT_AMBULATORY_CARE_PROVIDER_SITE_OTHER): Payer: Medicaid Other

## 2019-02-17 VITALS — BP 116/77 | HR 77

## 2019-02-17 DIAGNOSIS — O09212 Supervision of pregnancy with history of pre-term labor, second trimester: Secondary | ICD-10-CM

## 2019-02-17 DIAGNOSIS — O0990 Supervision of high risk pregnancy, unspecified, unspecified trimester: Secondary | ICD-10-CM

## 2019-02-17 NOTE — Progress Notes (Signed)
Patient presented to office today for 17 -p injection received in the  right arm. NDC #93734-287-68. Patient will returned in one week for next injection.

## 2019-02-17 NOTE — Progress Notes (Signed)
I have reviewed the chart and agree with nursing staff's documentation of this patient's encounter.  [place] Bing, MD 02/17/2019 10:47 AM

## 2019-02-18 MED ORDER — HYDROXYPROGESTERONE CAPROATE 275 MG/1.1ML ~~LOC~~ SOAJ
275.0000 mg | Freq: Once | SUBCUTANEOUS | Status: AC
  Administered 2019-02-17: 275 mg via SUBCUTANEOUS

## 2019-02-24 ENCOUNTER — Ambulatory Visit (INDEPENDENT_AMBULATORY_CARE_PROVIDER_SITE_OTHER): Payer: Medicaid Other | Admitting: *Deleted

## 2019-02-24 VITALS — BP 112/73 | HR 85

## 2019-02-24 DIAGNOSIS — O09219 Supervision of pregnancy with history of pre-term labor, unspecified trimester: Principal | ICD-10-CM

## 2019-02-24 DIAGNOSIS — Z3A26 26 weeks gestation of pregnancy: Secondary | ICD-10-CM

## 2019-02-24 DIAGNOSIS — O09899 Supervision of other high risk pregnancies, unspecified trimester: Secondary | ICD-10-CM

## 2019-02-24 DIAGNOSIS — O09212 Supervision of pregnancy with history of pre-term labor, second trimester: Secondary | ICD-10-CM | POA: Diagnosis not present

## 2019-02-24 MED ORDER — HYDROXYPROGESTERONE CAPROATE 275 MG/1.1ML ~~LOC~~ SOAJ
275.0000 mg | Freq: Once | SUBCUTANEOUS | Status: AC
  Administered 2019-02-24: 275 mg via SUBCUTANEOUS

## 2019-02-24 NOTE — Progress Notes (Signed)
Pt here today for her 17p. Pt denies any issues or complaints at this time. Pt tolerated injection well. Pt did state she is having hard time sleeping, gave her recommended sleep aids per protocol. Will follow up next week.   Scheryl Marten, RN

## 2019-02-28 NOTE — Progress Notes (Signed)
I have reviewed the chart and agree with nursing staff's documentation of this patient's encounter.  [place] Bing, MD 02/28/2019 10:11 AM

## 2019-03-03 ENCOUNTER — Encounter: Payer: Medicaid Other | Admitting: Family Medicine

## 2019-03-03 ENCOUNTER — Ambulatory Visit (INDEPENDENT_AMBULATORY_CARE_PROVIDER_SITE_OTHER): Payer: Medicaid Other | Admitting: Family Medicine

## 2019-03-03 ENCOUNTER — Encounter: Payer: Self-pay | Admitting: Family Medicine

## 2019-03-03 ENCOUNTER — Encounter: Payer: Self-pay | Admitting: *Deleted

## 2019-03-03 VITALS — BP 113/74 | HR 84 | Wt 128.0 lb

## 2019-03-03 DIAGNOSIS — Z3A27 27 weeks gestation of pregnancy: Secondary | ICD-10-CM

## 2019-03-03 DIAGNOSIS — O09899 Supervision of other high risk pregnancies, unspecified trimester: Secondary | ICD-10-CM

## 2019-03-03 DIAGNOSIS — O09219 Supervision of pregnancy with history of pre-term labor, unspecified trimester: Secondary | ICD-10-CM

## 2019-03-03 DIAGNOSIS — O09212 Supervision of pregnancy with history of pre-term labor, second trimester: Secondary | ICD-10-CM

## 2019-03-03 DIAGNOSIS — O0992 Supervision of high risk pregnancy, unspecified, second trimester: Secondary | ICD-10-CM

## 2019-03-03 DIAGNOSIS — O0990 Supervision of high risk pregnancy, unspecified, unspecified trimester: Secondary | ICD-10-CM

## 2019-03-03 MED ORDER — TETANUS-DIPHTH-ACELL PERTUSSIS 5-2.5-18.5 LF-MCG/0.5 IM SUSP
0.5000 mL | Freq: Once | INTRAMUSCULAR | Status: AC
  Administered 2019-03-03: 0.5 mL via INTRAMUSCULAR

## 2019-03-03 MED ORDER — HYDROXYPROGESTERONE CAPROATE 275 MG/1.1ML ~~LOC~~ SOAJ
275.0000 mg | Freq: Once | SUBCUTANEOUS | Status: AC
  Administered 2019-03-03: 275 mg via SUBCUTANEOUS

## 2019-03-03 NOTE — Patient Instructions (Signed)

## 2019-03-03 NOTE — Progress Notes (Signed)
   PRENATAL VISIT NOTE  Subjective:  Peggy [name] is a 33 y.o. [phone] at [redacted]w[redacted]d being seen today for ongoing prenatal care.  She is currently monitored for the following issues for this high-risk pregnancy and has Supervision of high risk pregnancy, antepartum; History of preterm delivery, currently pregnant; History of gastroschisis; and Asthma affecting pregnancy in third trimester on their problem list.  Patient reports no complaints.  Contractions: Irregular. Vag. Bleeding: None.  Movement: Present. Denies leaking of fluid.   The following portions of the patient's history were reviewed and updated as appropriate: allergies, current medications, past family history, past medical history, past social history, past surgical history and problem list. Problem list updated.  Objective:   Vitals:   03/03/19 1034  BP: 113/74  Pulse: 84  Weight: 128 lb (58.1 kg)    Fetal Status: Fetal Heart Rate (bpm): 145 Fundal Height: 26 cm Movement: Present     General:  Alert, oriented and cooperative. Patient is in no acute distress.  Skin: Skin is warm and dry. No rash noted.   Cardiovascular: Normal heart rate noted  Respiratory: Normal respiratory effort, no problems with respiration noted  Abdomen: Soft, gravid, appropriate for gestational age.  Pain/Pressure: Absent     Pelvic: Cervical exam deferred        Extremities: Normal range of motion.  Edema: Trace  Mental Status: Normal mood and affect. Normal behavior. Normal judgment and thought content.   Assessment and Plan:  Pregnancy: [id number] at [redacted]w[redacted]d  1. Supervision of high risk pregnancy, antepartum 28 wk labs tomorrow TDaP given today BTL papers signed. Patient counseled i.e. permanency of procedure.  - CBC; Future - Glucose Tolerance, 2 Hours w/1 Hour; Future - RPR; Future - HIV Antibody (routine testing w rflx); Future - Tdap (BOOSTRIX) injection 0.5 mL  2. History of preterm delivery, currently pregnant 17 P weekly  Preterm  labor symptoms and general obstetric precautions including but not limited to vaginal bleeding, contractions, leaking of fluid and fetal movement were reviewed in detail with the patient. Please refer to After Visit Summary for other counseling recommendations.  Return in 2 weeks (on 03/17/2019) for 17 P weekly.  Future Appointments  Date Time Provider Department Center  03/04/2019  8:00 AM [facility] NURSE [facility]  03/10/2019 10:30 AM [facility] NURSE [facility]  03/17/2019 10:15 AM Anyanwu, Jethro Bastos, MD [facility]    Reva Bores, MD

## 2019-03-03 NOTE — Progress Notes (Signed)
Pt would like to discuss a tubal

## 2019-03-04 DIAGNOSIS — O0990 Supervision of high risk pregnancy, unspecified, unspecified trimester: Secondary | ICD-10-CM

## 2019-03-05 LAB — RPR: RPR Ser Ql: NONREACTIVE

## 2019-03-05 LAB — CBC
Hematocrit: 32.4 % — ABNORMAL LOW (ref 34.0–46.6)
Hemoglobin: 11.1 g/dL (ref 11.1–15.9)
MCH: 31.4 pg (ref 26.6–33.0)
MCHC: 34.3 g/dL (ref 31.5–35.7)
MCV: 92 fL (ref 79–97)
PLATELETS: 142 10*3/uL — AB (ref 150–450)
RBC: 3.53 x10E6/uL — ABNORMAL LOW (ref 3.77–5.28)
RDW: 12.9 % (ref 11.7–15.4)
WBC: 10.9 10*3/uL — ABNORMAL HIGH (ref 3.4–10.8)

## 2019-03-05 LAB — HIV ANTIBODY (ROUTINE TESTING W REFLEX): HIV Screen 4th Generation wRfx: NONREACTIVE

## 2019-03-05 LAB — GLUCOSE TOLERANCE, 2 HOURS W/ 1HR
GLUCOSE, 1 HOUR: 105 mg/dL (ref 65–179)
Glucose, 2 hour: 114 mg/dL (ref 65–152)
Glucose, Fasting: 81 mg/dL (ref 65–91)

## 2019-03-10 ENCOUNTER — Other Ambulatory Visit: Payer: Self-pay

## 2019-03-10 ENCOUNTER — Ambulatory Visit (INDEPENDENT_AMBULATORY_CARE_PROVIDER_SITE_OTHER): Payer: Medicaid Other

## 2019-03-10 VITALS — BP 120/75 | HR 72 | Wt 130.4 lb

## 2019-03-10 DIAGNOSIS — O09212 Supervision of pregnancy with history of pre-term labor, second trimester: Secondary | ICD-10-CM | POA: Diagnosis not present

## 2019-03-10 DIAGNOSIS — O0990 Supervision of high risk pregnancy, unspecified, unspecified trimester: Secondary | ICD-10-CM

## 2019-03-10 MED ORDER — HYDROXYPROGESTERONE CAPROATE 275 MG/1.1ML ~~LOC~~ SOAJ
275.0000 mg | Freq: Once | SUBCUTANEOUS | Status: AC
  Administered 2019-03-10: 275 mg via SUBCUTANEOUS

## 2019-03-10 NOTE — Progress Notes (Signed)
Patient presented to the office today for her 17-p injection received in right arm IM. Ndc# [id number]. Patient will follow up in one week for her second injection.

## 2019-03-10 NOTE — Progress Notes (Signed)
Patient seen and assessed by nursing staff.  Agree with documentation and plan.  

## 2019-03-17 ENCOUNTER — Ambulatory Visit (INDEPENDENT_AMBULATORY_CARE_PROVIDER_SITE_OTHER): Payer: Medicaid Other | Admitting: Obstetrics & Gynecology

## 2019-03-17 ENCOUNTER — Other Ambulatory Visit: Payer: Self-pay

## 2019-03-17 VITALS — BP 108/71 | HR 87 | Wt 130.0 lb

## 2019-03-17 DIAGNOSIS — O09899 Supervision of other high risk pregnancies, unspecified trimester: Secondary | ICD-10-CM

## 2019-03-17 DIAGNOSIS — O09213 Supervision of pregnancy with history of pre-term labor, third trimester: Secondary | ICD-10-CM

## 2019-03-17 DIAGNOSIS — O99513 Diseases of the respiratory system complicating pregnancy, third trimester: Secondary | ICD-10-CM

## 2019-03-17 DIAGNOSIS — Z3A29 29 weeks gestation of pregnancy: Secondary | ICD-10-CM

## 2019-03-17 DIAGNOSIS — O09219 Supervision of pregnancy with history of pre-term labor, unspecified trimester: Principal | ICD-10-CM

## 2019-03-17 DIAGNOSIS — O0993 Supervision of high risk pregnancy, unspecified, third trimester: Secondary | ICD-10-CM

## 2019-03-17 DIAGNOSIS — J45909 Unspecified asthma, uncomplicated: Secondary | ICD-10-CM

## 2019-03-17 DIAGNOSIS — O0990 Supervision of high risk pregnancy, unspecified, unspecified trimester: Secondary | ICD-10-CM

## 2019-03-17 DIAGNOSIS — O4703 False labor before 37 completed weeks of gestation, third trimester: Secondary | ICD-10-CM

## 2019-03-17 MED ORDER — NIFEDIPINE ER OSMOTIC RELEASE 30 MG PO TB24: 30.0000 mg | ORAL_TABLET | Freq: Every day | ORAL | 2 refills | Status: DC

## 2019-03-17 MED ORDER — HYDROXYPROGESTERONE CAPROATE 275 MG/1.1ML ~~LOC~~ SOAJ
275.0000 mg | Freq: Once | SUBCUTANEOUS | Status: AC
  Administered 2019-03-17: 275 mg via SUBCUTANEOUS

## 2019-03-17 NOTE — Patient Instructions (Signed)
Return to office for any scheduled appointments. Call the office or go to the MAU at [facility] [place] if:  You begin to have strong, frequent contractions  Your water breaks.  Sometimes it is a big gush of fluid, sometimes it is just a trickle that keeps getting your panties wet or running down your legs  You have vaginal bleeding.  It is normal to have a small amount of spotting if your cervix was checked.   You do not feel your baby moving like normal.  If you do not, get something to eat and drink and lay down and focus on feeling your baby move.   If your baby is still not moving like normal, you should call the office or go to MAU.  Any other obstetric concerns.   

## 2019-03-17 NOTE — Progress Notes (Signed)
   PRENATAL VISIT NOTE  Subjective:  Peggy [name] is a 33 y.o. [phone] at [redacted]w[redacted]d being seen today for ongoing prenatal care.  She is currently monitored for the following issues for this high-risk pregnancy and has Supervision of high risk pregnancy, antepartum; History of preterm delivery, currently pregnant; History of gastroschisis; and Asthma affecting pregnancy in third trimester on their problem list.  Patient reports occasional contractions that are painful, especially in her back. Desires cervical check.  Contractions: Irregular. Vag. Bleeding: None.  Movement: Present. Denies leaking of fluid.   The following portions of the patient's history were reviewed and updated as appropriate: allergies, current medications, past family history, past medical history, past social history, past surgical history and problem list.   Objective:   Vitals:   03/17/19 1022  BP: 108/71  Pulse: 87  Weight: 130 lb (59 kg)    Fetal Status: Fetal Heart Rate (bpm): 145 Fundal Height: 29 cm Movement: Present     General:  Alert, oriented and cooperative. Patient is in no acute distress.  Skin: Skin is warm and dry. No rash noted.   Cardiovascular: Normal heart rate noted  Respiratory: Normal respiratory effort, no problems with respiration noted  Abdomen: Soft, gravid, appropriate for gestational age.  Pain/Pressure: Present     Pelvic: Cervical exam performed Dilation: Closed Effacement (%): Thick Station: Ballotable  Extremities: Normal range of motion.  Edema: Trace  Mental Status: Normal mood and affect. Normal behavior. Normal judgment and thought content.   Assessment and Plan:  Pregnancy: [id number] at [redacted]w[redacted]d 1. Preterm uterine contractions in third trimester, antepartum 2. History of preterm delivery, currently pregnant Closed cervix, patient reassured. Procardia ordered for symptomatic contractions. Continue weekly 17P.  - NIFEdipine (PROCARDIA-XL/NIFEDICAL-XL) 30 MG 24 hr tablet; Take 1  tablet (30 mg total) by mouth daily. Can increase to twice a day as needed for symptomatic contractions  Dispense: 60 tablet; Refill: 2  3. Asthma affecting pregnancy in third trimester Stable on Pulmicort and albuterol prn.  4. Supervision of high risk pregnancy, antepartum Preterm labor symptoms and general obstetric precautions including but not limited to vaginal bleeding, contractions, leaking of fluid and fetal movement were reviewed in detail with the patient. Please refer to After Visit Summary for other counseling recommendations.   Return in about 1 week (around 03/24/2019) for 17P only  2 weeks: 17P and OB visit.  Future Appointments  Date Time Provider Department Center  03/24/2019 10:15 AM [facility] NURSE [facility]  03/31/2019 10:15 AM Stanford Strauch, Jethro Bastos, MD [facility]  04/07/2019 10:15 AM [facility] NURSE [facility]  04/14/2019 10:15 AM Byron Bing, MD [facility]    Jaynie Collins, MD

## 2019-03-24 ENCOUNTER — Other Ambulatory Visit: Payer: Self-pay

## 2019-03-24 ENCOUNTER — Ambulatory Visit (INDEPENDENT_AMBULATORY_CARE_PROVIDER_SITE_OTHER): Payer: Medicaid Other | Admitting: *Deleted

## 2019-03-24 VITALS — BP 99/66 | HR 88

## 2019-03-24 DIAGNOSIS — O09213 Supervision of pregnancy with history of pre-term labor, third trimester: Secondary | ICD-10-CM | POA: Diagnosis not present

## 2019-03-24 DIAGNOSIS — O09899 Supervision of other high risk pregnancies, unspecified trimester: Secondary | ICD-10-CM

## 2019-03-24 DIAGNOSIS — O09219 Supervision of pregnancy with history of pre-term labor, unspecified trimester: Principal | ICD-10-CM

## 2019-03-24 MED ORDER — HYDROXYPROGESTERONE CAPROATE 275 MG/1.1ML ~~LOC~~ SOAJ
275.0000 mg | Freq: Once | SUBCUTANEOUS | Status: AC
  Administered 2019-03-24: 275 mg via SUBCUTANEOUS

## 2019-03-24 NOTE — Progress Notes (Signed)
Patient seen and assessed by nursing staff.  Agree with documentation and plan.  

## 2019-03-24 NOTE — Progress Notes (Signed)
Pt here for 17p injection today. Pt denies any issues at this time. Injection tolerated well. Pt was offered to do her own injections due to COVID-19. Pt agreeable and understands. Educated on giving injections, refills ordered and will be sent to patients home. Pt to have virtual visit in 3 weeks for ROB and was added to babscripts optimization for BP monitoring, pt has own BP cuff at home.  Scheryl Marten, RN

## 2019-03-31 ENCOUNTER — Encounter: Payer: Medicaid Other | Admitting: Obstetrics & Gynecology

## 2019-03-31 ENCOUNTER — Telehealth: Payer: Self-pay

## 2019-03-31 NOTE — Telephone Encounter (Signed)
Received called from baby scripts regarding a high reading this am. First reading was 145/98.Marland Kitchen Patient waited a couple minutes and took it again. The reading was 126/77. Patient reports feeling fine but she is having some nausea at times. Advised patient to check her blood pressure again this afternoon and before bed to make sure it is within normal range. Patient verbal understanding at this time.

## 2019-04-07 ENCOUNTER — Ambulatory Visit: Payer: Medicaid Other

## 2019-04-14 ENCOUNTER — Other Ambulatory Visit: Payer: Medicaid Other

## 2019-04-14 ENCOUNTER — Encounter: Payer: Self-pay | Admitting: Obstetrics and Gynecology

## 2019-04-14 ENCOUNTER — Other Ambulatory Visit: Payer: Self-pay

## 2019-04-14 ENCOUNTER — Ambulatory Visit (INDEPENDENT_AMBULATORY_CARE_PROVIDER_SITE_OTHER): Payer: Medicaid Other | Admitting: Obstetrics and Gynecology

## 2019-04-14 DIAGNOSIS — O09219 Supervision of pregnancy with history of pre-term labor, unspecified trimester: Secondary | ICD-10-CM

## 2019-04-14 DIAGNOSIS — O0990 Supervision of high risk pregnancy, unspecified, unspecified trimester: Secondary | ICD-10-CM

## 2019-04-14 DIAGNOSIS — D696 Thrombocytopenia, unspecified: Secondary | ICD-10-CM | POA: Diagnosis not present

## 2019-04-14 DIAGNOSIS — J45909 Unspecified asthma, uncomplicated: Secondary | ICD-10-CM | POA: Diagnosis not present

## 2019-04-14 DIAGNOSIS — O09213 Supervision of pregnancy with history of pre-term labor, third trimester: Secondary | ICD-10-CM | POA: Diagnosis not present

## 2019-04-14 DIAGNOSIS — Z87761 Personal history of (corrected) gastroschisis: Secondary | ICD-10-CM

## 2019-04-14 DIAGNOSIS — Z87738 Personal history of other specified (corrected) congenital malformations of digestive system: Secondary | ICD-10-CM | POA: Diagnosis not present

## 2019-04-14 DIAGNOSIS — Z3A33 33 weeks gestation of pregnancy: Secondary | ICD-10-CM | POA: Diagnosis not present

## 2019-04-14 DIAGNOSIS — O99513 Diseases of the respiratory system complicating pregnancy, third trimester: Secondary | ICD-10-CM | POA: Diagnosis not present

## 2019-04-14 DIAGNOSIS — O99113 Other diseases of the blood and blood-forming organs and certain disorders involving the immune mechanism complicating pregnancy, third trimester: Secondary | ICD-10-CM | POA: Diagnosis not present

## 2019-04-14 DIAGNOSIS — O09899 Supervision of other high risk pregnancies, unspecified trimester: Secondary | ICD-10-CM

## 2019-04-14 DIAGNOSIS — O99119 Other diseases of the blood and blood-forming organs and certain disorders involving the immune mechanism complicating pregnancy, unspecified trimester: Secondary | ICD-10-CM

## 2019-04-14 LAB — CBC
Hematocrit: 31.8 % — ABNORMAL LOW (ref 34.0–46.6)
Hemoglobin: 10.7 g/dL — ABNORMAL LOW (ref 11.1–15.9)
MCH: 29.6 pg (ref 26.6–33.0)
MCHC: 33.6 g/dL (ref 31.5–35.7)
MCV: 88 fL (ref 79–97)
Platelets: 154 10*3/uL (ref 150–450)
RBC: 3.62 x10E6/uL — ABNORMAL LOW (ref 3.77–5.28)
RDW: 11.7 % (ref 11.7–15.4)
WBC: 10.7 10*3/uL (ref 3.4–10.8)

## 2019-04-14 MED ORDER — ALBUTEROL SULFATE HFA 108 (90 BASE) MCG/ACT IN AERS: 1.0000 | INHALATION_SPRAY | Freq: Four times a day (QID) | RESPIRATORY_TRACT | 2 refills | Status: DC | PRN

## 2019-04-14 NOTE — Progress Notes (Signed)
   TELEHEALTH VIRTUAL OBSTETRICS VISIT ENCOUNTER NOTE  I connected with [name] on 04/14/19 at 10:15 AM EDT by telephone at home and verified that I am speaking with the correct person using two identifiers.   I discussed the limitations, risks, security and privacy concerns of performing an evaluation and management service by telephone and the availability of in person appointments. I also discussed with the patient that there may be a patient responsible charge related to this service. The patient expressed understanding and agreed to proceed.  Subjective:  Peggy [name] is a 33 y.o. [phone] at [redacted]w[redacted]d being followed for ongoing prenatal care.  She is currently monitored for the following issues for this high-risk pregnancy and has Supervision of high risk pregnancy, antepartum; History of preterm delivery, currently pregnant; History of gastroschisis; Asthma affecting pregnancy in third trimester; and Gestational thrombocytopenia (HCC) on their problem list.  Patient reports no complaints. Reports fetal movement. Denies any contractions, bleeding or leaking of fluid.   The following portions of the patient's history were reviewed and updated as appropriate: allergies, current medications, past family history, past medical history, past social history, past surgical history and problem list.   Objective:   General:  Alert, oriented and cooperative.   Mental Status: Normal mood and affect perceived. Normal judgment and thought content.  Rest of physical exam deferred due to type of encounter  Assessment and Plan:  Pregnancy: [id number] at [redacted]w[redacted]d 1. Supervision of high risk pregnancy, antepartum Routine care. btl papers utd. gbs nv. Baby scripts data looks good  2. History of preterm delivery, currently pregnant Doing 17p at home today and qwk  3. Asthma affecting pregnancy in third trimester Needing the pulmicort once per week. D/w her it's a maintenance med and if only needing it once  a week to just do albuterol prn  4. History of gastroschisis  5. Benign gestational thrombocytopenia in third trimester [facility]) To come in for rpt cbc  Preterm labor symptoms and general obstetric precautions including but not limited to vaginal bleeding, contractions, leaking of fluid and fetal movement were reviewed in detail with the patient.  I discussed the assessment and treatment plan with the patient. The patient was provided an opportunity to ask questions and all were answered. The patient agreed with the plan and demonstrated an understanding of the instructions. The patient was advised to call back or seek an in-person office evaluation/go to MAU at [facility] for any urgent or concerning symptoms. Please refer to After Visit Summary for other counseling recommendations.   I provided 10 minutes of non-face-to-face time during this encounter.  No follow-ups on file.  Future Appointments  Date Time Provider Department Center  05/05/2019 10:30 AM [place] Bing, MD [facility]    Tyhee Bing, MD [facility], [facility]

## 2019-05-05 ENCOUNTER — Other Ambulatory Visit (HOSPITAL_COMMUNITY)
Admission: RE | Admit: 2019-05-05 | Discharge: 2019-05-05 | Disposition: A | Discharge status: Home / Self Care | Payer: Medicaid Other | Source: Ambulatory Visit | Attending: Obstetrics and Gynecology | Admitting: Obstetrics and Gynecology

## 2019-05-05 ENCOUNTER — Ambulatory Visit (INDEPENDENT_AMBULATORY_CARE_PROVIDER_SITE_OTHER): Payer: Medicaid Other | Admitting: Obstetrics and Gynecology

## 2019-05-05 ENCOUNTER — Other Ambulatory Visit: Payer: Self-pay

## 2019-05-05 VITALS — BP 117/76 | HR 78 | Wt 132.0 lb

## 2019-05-05 DIAGNOSIS — O0990 Supervision of high risk pregnancy, unspecified, unspecified trimester: Secondary | ICD-10-CM

## 2019-05-05 DIAGNOSIS — O26843 Uterine size-date discrepancy, third trimester: Secondary | ICD-10-CM

## 2019-05-05 DIAGNOSIS — O0993 Supervision of high risk pregnancy, unspecified, third trimester: Secondary | ICD-10-CM

## 2019-05-05 DIAGNOSIS — O09219 Supervision of pregnancy with history of pre-term labor, unspecified trimester: Secondary | ICD-10-CM

## 2019-05-05 DIAGNOSIS — Z3A36 36 weeks gestation of pregnancy: Secondary | ICD-10-CM

## 2019-05-05 DIAGNOSIS — O09899 Supervision of other high risk pregnancies, unspecified trimester: Secondary | ICD-10-CM

## 2019-05-05 DIAGNOSIS — O09213 Supervision of pregnancy with history of pre-term labor, third trimester: Secondary | ICD-10-CM

## 2019-05-05 NOTE — Progress Notes (Signed)
Prenatal Visit Note Date: 05/05/2019 Clinic: [facility]  Subjective:  Peggy [name] is a 33 y.o. [phone] at [redacted]w[redacted]d being seen today for ongoing prenatal care.  She is currently monitored for the following issues for this high-risk pregnancy and has Supervision of high risk pregnancy, antepartum; History of preterm delivery, currently pregnant; History of gastroschisis; Asthma affecting pregnancy in third trimester; Gestational thrombocytopenia (HCC); and Fundal height low for dates, third trimester on their problem list.  Patient reports no complaints.   Contractions: Not present. Vag. Bleeding: None.  Movement: Present. Denies leaking of fluid.   The following portions of the patient's history were reviewed and updated as appropriate: allergies, current medications, past family history, past medical history, past social history, past surgical history and problem list. Problem list updated.  Objective:   Vitals:   05/05/19 1036  BP: 117/76  Pulse: 78  Weight: 132 lb (59.9 kg)    Fetal Status: Fetal Heart Rate (bpm): 156 Fundal Height: 33 cm Movement: Present     General:  Alert, oriented and cooperative. Patient is in no acute distress.  Skin: Skin is warm and dry. No rash noted.   Cardiovascular: Normal heart rate noted  Respiratory: Normal respiratory effort, no problems with respiration noted  Abdomen: Soft, gravid, appropriate for gestational age. Pain/Pressure: Present     Pelvic:  Cervical exam performed Dilation: Fingertip Effacement (%): Thick Station: Ballotable  Extremities: Normal range of motion.  Edema: Trace  Mental Status: Normal mood and affect. Normal behavior. Normal judgment and thought content.   Urinalysis:      Assessment and Plan:  Pregnancy: [id number] at [redacted]w[redacted]d  1. Supervision of high risk pregnancy, antepartum Routine care. BTL papers UTD - Cervicovaginal ancillary only( [place]) - Strep Gp B NAA  2. Fundal height low for dates,  third trimester [facility] precautions given. Growth u/s ordered - US MFM OB FOLLOW UP; Future  3. History of preterm delivery, currently pregnant S/p last 17p already  Preterm labor symptoms and general obstetric precautions including but not limited to vaginal bleeding, contractions, leaking of fluid and fetal movement were reviewed in detail with the patient. Please refer to After Visit Summary for other counseling recommendations.  Return in about 10 days (around 05/15/2019) for virtual rob.   [place] Bing, MD

## 2019-05-06 ENCOUNTER — Ambulatory Visit (HOSPITAL_COMMUNITY): Payer: Medicaid Other | Admitting: *Deleted

## 2019-05-06 ENCOUNTER — Encounter (HOSPITAL_COMMUNITY): Payer: Self-pay

## 2019-05-06 ENCOUNTER — Ambulatory Visit (HOSPITAL_COMMUNITY)
Admission: RE | Admit: 2019-05-06 | Discharge: 2019-05-06 | Disposition: A | Discharge status: Home / Self Care | Payer: Medicaid Other | Source: Ambulatory Visit | Attending: Obstetrics and Gynecology | Admitting: Obstetrics and Gynecology

## 2019-05-06 VITALS — Temp 98.2°F

## 2019-05-06 DIAGNOSIS — Z3A36 36 weeks gestation of pregnancy: Secondary | ICD-10-CM | POA: Diagnosis not present

## 2019-05-06 DIAGNOSIS — Z362 Encounter for other antenatal screening follow-up: Secondary | ICD-10-CM | POA: Diagnosis not present

## 2019-05-06 DIAGNOSIS — O09219 Supervision of pregnancy with history of pre-term labor, unspecified trimester: Secondary | ICD-10-CM

## 2019-05-06 DIAGNOSIS — O0990 Supervision of high risk pregnancy, unspecified, unspecified trimester: Secondary | ICD-10-CM | POA: Insufficient documentation

## 2019-05-06 DIAGNOSIS — O26843 Uterine size-date discrepancy, third trimester: Secondary | ICD-10-CM | POA: Diagnosis not present

## 2019-05-06 DIAGNOSIS — Z87798 Personal history of other (corrected) congenital malformations: Secondary | ICD-10-CM

## 2019-05-06 LAB — CERVICOVAGINAL ANCILLARY ONLY
Chlamydia: NEGATIVE
Neisseria Gonorrhea: NEGATIVE

## 2019-05-07 LAB — STREP GP B NAA: Strep Gp B NAA: NEGATIVE

## 2019-05-09 ENCOUNTER — Encounter: Payer: Self-pay | Admitting: Obstetrics and Gynecology

## 2019-05-09 DIAGNOSIS — O283 Abnormal ultrasonic finding on antenatal screening of mother: Secondary | ICD-10-CM | POA: Insufficient documentation

## 2019-05-12 ENCOUNTER — Encounter: Payer: Medicaid Other | Admitting: Obstetrics & Gynecology

## 2019-05-12 ENCOUNTER — Other Ambulatory Visit: Payer: Self-pay

## 2019-05-12 ENCOUNTER — Telehealth (INDEPENDENT_AMBULATORY_CARE_PROVIDER_SITE_OTHER): Payer: Medicaid Other | Admitting: Obstetrics & Gynecology

## 2019-05-12 VITALS — BP 125/75 | Wt 136.0 lb

## 2019-05-12 DIAGNOSIS — O283 Abnormal ultrasonic finding on antenatal screening of mother: Secondary | ICD-10-CM | POA: Diagnosis not present

## 2019-05-12 DIAGNOSIS — Z3A37 37 weeks gestation of pregnancy: Secondary | ICD-10-CM

## 2019-05-12 DIAGNOSIS — O0990 Supervision of high risk pregnancy, unspecified, unspecified trimester: Secondary | ICD-10-CM

## 2019-05-12 NOTE — Progress Notes (Signed)
TELEHEALTH VIRTUAL OBSTETRICS PRENATAL VISIT ENCOUNTER NOTE  I connected with Peggy [name] on 05/12/19 at  1:45 PM EDT by MyChart Video Visit at home and verified that I am speaking with the correct person using two identifiers.   I discussed the limitations, risks, security and privacy concerns of performing an evaluation and management service by telephone and the availability of in person appointments. I also discussed with the patient that there may be a patient responsible charge related to this service. The patient expressed understanding and agreed to proceed. Subjective:  Peggy [name] is a 33 y.o. [phone] at [redacted]w[redacted]d being seen today for ongoing prenatal care.  She is currently monitored for the following issues for this high-risk pregnancy and has Supervision of high risk pregnancy, antepartum; History of preterm delivery, currently pregnant; History of gastroschisis; Asthma affecting pregnancy in third trimester; Gestational thrombocytopenia (HCC); Fundal height low for dates, third trimester; and Small fetal head circumference on ultrasound @ 36wks on their problem list.  Patient reports occasional contractions.  Reports fetal movement. Contractions: Irregular. Vag. Bleeding: None.  Movement: Present. Denies any contractions, bleeding or leaking of fluid.   The following portions of the patient's history were reviewed and updated as appropriate: allergies, current medications, past family history, past medical history, past social history, past surgical history and problem list.   Objective:   Vitals:   05/12/19 1356  BP: 125/75  Weight: 136 lb (61.7 kg)    Fetal Status:     Movement: Present     General:  Alert, oriented and cooperative. Patient is in no acute distress.  Respiratory: Normal respiratory effort, no problems with respiration noted  Mental Status: Normal mood and affect. Normal behavior. Normal judgment and thought content.  Rest of physical exam deferred due to  type of encounter  Imaging: [place] Mfm Ob Follow Up  Result Date: 05/06/2019 ----------------------------------------------------------------------  OBSTETRICS REPORT                       (Signed Final 05/06/2019 09:54 am) ---------------------------------------------------------------------- Patient Info  ID #:       [id number]                          D.O.B.:  1986/02/11 (33 yrs)  Name:       Peggy [name]                   Visit Date: 05/06/2019 09:02 am ---------------------------------------------------------------------- Performed By  Performed By:     Emeline Darling BS,      Ref. Address:     [address]                    [address]                                                             [address]  Attending:        Lin Landsman      [facility]                    MD  [facility]etal [facility]  Referred By:      [facility] ---------------------------------------------------------------------- Orders   #  Description                          Code         Ordered By   1  [place] MFM OB FOLLOW UP                  [phone]     [place] [facility]  ----------------------------------------------------------------------   #  Order #                    Accession #                 Episode #   1  [id number]                  [id number]                  [id number]  ---------------------------------------------------------------------- Indications   Uterine size-date discrepancy, third trimester O26.843   Encounter for other antenatal screening        Z36.2   follow-up   [redacted] weeks gestation of pregnancy                Z3A.36   Poor obstetric history: Previous preterm       O09.219   delivery, antepartum x 3 (22, 36 and 36   weeks)(Negative AFP)(Low Risk NIPS)   Personal history of congenital abnormality     Z87.798   (gastroschisis)  ---------------------------------------------------------------------- Vital Signs  Weight (lb): 132                               Height:         4'11"  BMI:         26.66 ---------------------------------------------------------------------- Fetal Evaluation  Num Of Fetuses:         1  Fetal Heart Rate(bpm):  132  Cardiac Activity:       Observed  Presentation:           Cephalic  Placenta:               Posterior  P. Cord Insertion:      Previously Visualized  Amniotic Fluid  AFI FV:      Within normal limits  AFI Sum(cm)     %Tile       Largest Pocket(cm)  16.04           60          4.81  RUQ(cm)       RLQ(cm)       LUQ(cm)        LLQ(cm)  3.66          3.01          4.81           4.56 ---------------------------------------------------------------------- Biometry  BPD:      81.7  mm     G. Age:  32w 6d        < 1  %    CI:        77.82   %    70 - 86  FL/HC:      24.0   %    20.8 - 22.6  HC:      293.1  mm     G. Age:  32w 2d        < 3  %    HC/AC:      0.90        0.92 - 1.05  AC:      324.2  mm     G. Age:  36w 2d         55  %    FL/BPD:     86.2   %    71 - 87  FL:       70.4  mm     G. Age:  36w 1d         36  %    FL/AC:      21.7   %    20 - 24  Est. FW:    2675  gm    5 lb 14 oz      40  % ---------------------------------------------------------------------- OB History  Gravidity:    6         Term:   0        Prem:   3        SAB:   0  TOP:          1       Ectopic:  1        Living: 3 ---------------------------------------------------------------------- Gestational Age  LMP:           35w 1d        Date:  09/02/18                 EDD:   06/09/19  U/S Today:     34w 3d                                        EDD:   06/14/19  Best:          36w 4d     Det. ByMarcella Dubs         EDD:   05/30/19                                      (10/04/18) ---------------------------------------------------------------------- Anatomy  Cranium:               Appears normal         Aortic Arch:            Previously seen  Cavum:                 Previously seen        Ductal Arch:             Previously seen  Ventricles:            Previously seen        Diaphragm:              Appears normal  Choroid Plexus:        Previously seen        Stomach:                Appears normal, left  sided  Cerebellum:            Previously seen        Abdomen:                Appears normal  Posterior Fossa:       Previously seen        Abdominal Wall:         Previously seen  Nuchal Fold:           Previously seen        Cord Vessels:           Previously seen  Face:                  Orbits and profile     Kidneys:                Appear normal                         previously seen  Lips:                  Previously seen        Bladder:                Appears normal  Thoracic:              Appears normal         Spine:                  Previously seen  Heart:                 Appears normal         Upper Extremities:      Previously seen                         (4CH, axis, and situs  RVOT:                  Appears normal         Lower Extremities:      Previously seen  LVOT:                  Appears normal  Other:  Fetus appears to be female. Heels, 5th digit, Nasal bone, and 3VV          previously visualized. ---------------------------------------------------------------------- Cervix Uterus Adnexa  Cervix  Not visualized (advanced GA >24wks) ---------------------------------------------------------------------- Comments  I reviewed today's exam of normal growth however, the HC is  between the 2-3 SD from the mean. This is not pathologic  microcephaly. ---------------------------------------------------------------------- Impression  Normal interval growth  HC between 2-3 SD  Low risk NIPS ---------------------------------------------------------------------- Recommendations  Follow up as clinically indicated. ----------------------------------------------------------------------               Lin Landsmanorenthian Booker, MD Electronically Signed Final  Report   05/06/2019 09:54 am ----------------------------------------------------------------------   Assessment and Plan:  Pregnancy: [id number] at 2477w3d 1. Supervision of high risk pregnancy, antepartum 2. Small fetal head circumference on ultrasound @ 36wks No concerns. Term labor symptoms and general obstetric precautions including but not limited to vaginal bleeding, contractions, leaking of fluid and fetal movement were reviewed in detail with the patient. I discussed the assessment and treatment plan with the patient. The patient was provided an opportunity to ask questions and all were answered. The patient agreed with the plan and demonstrated an understanding of  the instructions. The patient was advised to call back or seek an in-person office evaluation/go to [facility]U at [facility] for any urgent or concerning symptoms. Please refer to After Visit Summary for other counseling recommendations.    Return in about 1 week (around 05/19/2019) for Virtual OB Visit.  No future appointments.  Jaynie Collins, MD [facility] [employer], [facility]

## 2019-05-12 NOTE — Progress Notes (Signed)
I connected with  Peggy [name] on 05/12/19 at  1:45 PM EDT by telephone and verified that I am speaking with the correct person using two identifiers.   I discussed the limitations, risks, security and privacy concerns of performing an evaluation and management service by telephone and the availability of in person appointments. I also discussed with the patient that there may be a patient responsible charge related to this service. The patient expressed understanding and agreed to proceed.  Scheryl Marten, RN 05/12/2019  1:57 PM

## 2019-05-12 NOTE — Patient Instructions (Signed)
Return to office for any scheduled appointments. Call the office or go to the MAU at [facility] if:  You begin to have strong, frequent contractions  Your water breaks.  Sometimes it is a big gush of fluid, sometimes it is just a trickle that keeps getting your panties wet or running down your legs  You have vaginal bleeding.  It is normal to have a small amount of spotting if your cervix was checked.   You do not feel your baby moving like normal.  If you do not, get something to eat and drink and lay down and focus on feeling your baby move.   If your baby is still not moving like normal, you should call the office or go to MAU.  Any other obstetric concerns.   

## 2019-05-19 ENCOUNTER — Encounter: Payer: Medicaid Other | Admitting: Obstetrics & Gynecology

## 2019-05-19 ENCOUNTER — Telehealth (INDEPENDENT_AMBULATORY_CARE_PROVIDER_SITE_OTHER): Payer: Medicaid Other | Admitting: Obstetrics & Gynecology

## 2019-05-19 ENCOUNTER — Encounter: Payer: Self-pay | Admitting: Obstetrics & Gynecology

## 2019-05-19 DIAGNOSIS — Z3A38 38 weeks gestation of pregnancy: Secondary | ICD-10-CM

## 2019-05-19 DIAGNOSIS — O0990 Supervision of high risk pregnancy, unspecified, unspecified trimester: Secondary | ICD-10-CM

## 2019-05-19 DIAGNOSIS — O283 Abnormal ultrasonic finding on antenatal screening of mother: Secondary | ICD-10-CM

## 2019-05-19 NOTE — Progress Notes (Signed)
TELEHEALTH OBSTETRICS PRENATAL VIRTUAL VIDEO VISIT ENCOUNTER NOTE  Provider location: [facility]   I connected with Peggy [name] on 05/19/19 at 10:30 AM EDT by MyChart Video Encounter at home and verified that I am speaking with the correct person using two identifiers.   I discussed the limitations, risks, security and privacy concerns of performing an evaluation and management service by telephone and the availability of in person appointments. I also discussed with the patient that there may be a patient responsible charge related to this service. The patient expressed understanding and agreed to proceed. Subjective:  Peggy [name] is a 33 y.o. [phone] at [redacted]w[redacted]d being seen today for ongoing prenatal care.  She is currently monitored for the following issues for this high-risk pregnancy and has Supervision of high risk pregnancy, antepartum; History of preterm delivery, currently pregnant; History of gastroschisis; Asthma affecting pregnancy in third trimester; Gestational thrombocytopenia (HCC); Fundal height low for dates, third trimester; and Small fetal head circumference on ultrasound @ 36wks on their problem list.  Patient reports no complaints.   .  .   . Denies any leaking of fluid.   The following portions of the patient's history were reviewed and updated as appropriate: allergies, current medications, past family history, past medical history, past social history, past surgical history and problem list.   Objective:  There were no vitals filed for this visit.  Fetal Status:           General:  Alert, oriented and cooperative. Patient is in no acute distress.  Respiratory: Normal respiratory effort, no problems with respiration noted  Mental Status: Normal mood and affect. Normal behavior. Normal judgment and thought content.  Rest of physical exam deferred due to type of encounter  Imaging: [place] Mfm Ob Follow Up  Result Date: 05/06/2019  ----------------------------------------------------------------------  OBSTETRICS REPORT                       (Signed Final 05/06/2019 09:54 am) ---------------------------------------------------------------------- Patient Info  ID #:       [id number]                          D.O.B.:  01-Nov-1986 (33 yrs)  Name:       Peggy [name]                   Visit Date: 05/06/2019 09:02 am ---------------------------------------------------------------------- Performed By  Performed By:     Emeline Darling BS,      Ref. Address:     [address]                    [address]                                                             [address]  Attending:        Lin Landsman      [facility]                    MD  [facility]etal [facility]  Referred By:      [facility] ---------------------------------------------------------------------- Orders   #  Description                          Code         Ordered By   1  [place] MFM OB FOLLOW UP                  [phone]     [place] [facility]  ----------------------------------------------------------------------   #  Order #                    Accession #                 Episode #   1  [id number]                  [id number]                  [id number]  ---------------------------------------------------------------------- Indications   Uterine size-date discrepancy, third trimester O26.843   Encounter for other antenatal screening        Z36.2   follow-up   [redacted] weeks gestation of pregnancy                Z3A.36   Poor obstetric history: Previous preterm       O09.219   delivery, antepartum x 3 (22, 36 and 36   weeks)(Negative AFP)(Low Risk NIPS)   Personal history of congenital abnormality     Z87.798   (gastroschisis)  ---------------------------------------------------------------------- Vital Signs  Weight (lb): 132                               Height:        4'11"  BMI:         26.66  ---------------------------------------------------------------------- Fetal Evaluation  Num Of Fetuses:         1  Fetal Heart Rate(bpm):  132  Cardiac Activity:       Observed  Presentation:           Cephalic  Placenta:               Posterior  P. Cord Insertion:      Previously Visualized  Amniotic Fluid  AFI FV:      Within normal limits  AFI Sum(cm)     %Tile       Largest Pocket(cm)  16.04           60          4.81  RUQ(cm)       RLQ(cm)       LUQ(cm)        LLQ(cm)  3.66          3.01          4.81           4.56 ---------------------------------------------------------------------- Biometry  BPD:      81.7  mm     G. Age:  32w 6d        < 1  %    CI:        77.82   %    70 - 86  FL/HC:      24.0   %    20.8 - 22.6  HC:      293.1  mm     G. Age:  32w 2d        < 3  %    HC/AC:      0.90        0.92 - 1.05  AC:      324.2  mm     G. Age:  36w 2d         55  %    FL/BPD:     86.2   %    71 - 87  FL:       70.4  mm     G. Age:  36w 1d         36  %    FL/AC:      21.7   %    20 - 24  Est. FW:    2675  gm    5 lb 14 oz      40  % ---------------------------------------------------------------------- OB History  Gravidity:    6         Term:   0        Prem:   3        SAB:   0  TOP:          1       Ectopic:  1        Living: 3 ---------------------------------------------------------------------- Gestational Age  LMP:           35w 1d        Date:  09/02/18                 EDD:   06/09/19  U/S Today:     34w 3d                                        EDD:   06/14/19  Best:          36w 4d     Det. ByMarcella Dubs:  Early Ultrasound         EDD:   05/30/19                                      (10/04/18) ---------------------------------------------------------------------- Anatomy  Cranium:               Appears normal         Aortic Arch:            Previously seen  Cavum:                 Previously seen        Ductal Arch:            Previously seen  Ventricles:             Previously seen        Diaphragm:              Appears normal  Choroid Plexus:        Previously seen        Stomach:                Appears normal, left  sided  Cerebellum:            Previously seen        Abdomen:                Appears normal  Posterior Fossa:       Previously seen        Abdominal Wall:         Previously seen  Nuchal Fold:           Previously seen        Cord Vessels:           Previously seen  Face:                  Orbits and profile     Kidneys:                Appear normal                         previously seen  Lips:                  Previously seen        Bladder:                Appears normal  Thoracic:              Appears normal         Spine:                  Previously seen  Heart:                 Appears normal         Upper Extremities:      Previously seen                         (4CH, axis, and situs  RVOT:                  Appears normal         Lower Extremities:      Previously seen  LVOT:                  Appears normal  Other:  Fetus appears to be female. Heels, 5th digit, Nasal bone, and 3VV          previously visualized. ---------------------------------------------------------------------- Cervix Uterus Adnexa  Cervix  Not visualized (advanced GA >24wks) ---------------------------------------------------------------------- Comments  I reviewed today's exam of normal growth however, the HC is  between the 2-3 SD from the mean. This is not pathologic  microcephaly. ---------------------------------------------------------------------- Impression  Normal interval growth  HC between 2-3 SD  Low risk NIPS ---------------------------------------------------------------------- Recommendations  Follow up as clinically indicated. ----------------------------------------------------------------------               Lin Landsman, MD Electronically Signed Final Report   05/06/2019 09:54 am  ----------------------------------------------------------------------   Assessment and Plan:  Pregnancy: [id number] at [redacted]w[redacted]d 1. Small fetal head circumference on ultrasound @ 36wks Within 2-3 SD, no concerns.   2. Supervision of high risk pregnancy, antepartum Term labor symptoms and general obstetric precautions including but not limited to vaginal bleeding, contractions, leaking of fluid and fetal movement were reviewed in detail with the patient. I discussed the assessment and treatment plan with the patient. The patient was provided an opportunity to ask questions and all were answered. The patient agreed with the plan  and demonstrated an understanding of the instructions. The patient was advised to call back or seek an in-person office evaluation/go to MAU at [facility] for any urgent or concerning symptoms. Please refer to After Visit Summary for other counseling recommendations.   Return in about 1 week (around 05/26/2019) for Virtual OB Visit  2 weeks: OFFICE OB Visit, NST.  No future appointments.  Jaynie Collins, MD [facility] [employer], [facility]

## 2019-05-19 NOTE — Patient Instructions (Signed)
Return to office for any scheduled appointments. Call the office or go to the MAU at [facility] [place] if:  You begin to have strong, frequent contractions  Your water breaks.  Sometimes it is a big gush of fluid, sometimes it is just a trickle that keeps getting your panties wet or running down your legs  You have vaginal bleeding.  It is normal to have a small amount of spotting if your cervix was checked.   You do not feel your baby moving like normal.  If you do not, get something to eat and drink and lay down and focus on feeling your baby move.   If your baby is still not moving like normal, you should call the office or go to MAU.  Any other obstetric concerns.   

## 2019-05-19 NOTE — Progress Notes (Signed)
I connected with  [name] on 05/19/19 at 10:30 AM EDT by telephone and verified that I am speaking with the correct person using two identifiers.   I discussed the limitations, risks, security and privacy concerns of performing an evaluation and management service by telephone and the availability of in person appointments. I also discussed with the patient that there may be a patient responsible charge related to this service. The patient expressed understanding and agreed to proceed.  Landon Truax Emeline Darling, CMA 05/19/2019  10:53 AM

## 2019-05-23 ENCOUNTER — Other Ambulatory Visit: Payer: Self-pay

## 2019-05-23 ENCOUNTER — Inpatient Hospital Stay (HOSPITAL_COMMUNITY)
Admission: AD | Admit: 2019-05-23 | Discharge: 2019-05-25 | DRG: 798 | Disposition: A | Discharge status: Home / Self Care | Payer: Medicaid Other | Attending: Obstetrics & Gynecology | Admitting: Obstetrics & Gynecology

## 2019-05-23 ENCOUNTER — Encounter (HOSPITAL_COMMUNITY): Payer: Self-pay

## 2019-05-23 ENCOUNTER — Inpatient Hospital Stay (HOSPITAL_COMMUNITY): Payer: Medicaid Other | Admitting: Anesthesiology

## 2019-05-23 DIAGNOSIS — J45909 Unspecified asthma, uncomplicated: Secondary | ICD-10-CM | POA: Diagnosis present

## 2019-05-23 DIAGNOSIS — O9952 Diseases of the respiratory system complicating childbirth: Secondary | ICD-10-CM | POA: Diagnosis present

## 2019-05-23 DIAGNOSIS — Z3689 Encounter for other specified antenatal screening: Secondary | ICD-10-CM | POA: Diagnosis not present

## 2019-05-23 DIAGNOSIS — O9912 Other diseases of the blood and blood-forming organs and certain disorders involving the immune mechanism complicating childbirth: Principal | ICD-10-CM | POA: Diagnosis present

## 2019-05-23 DIAGNOSIS — Z87891 Personal history of nicotine dependence: Secondary | ICD-10-CM | POA: Diagnosis not present

## 2019-05-23 DIAGNOSIS — Z302 Encounter for sterilization: Secondary | ICD-10-CM | POA: Diagnosis not present

## 2019-05-23 DIAGNOSIS — Z3A39 39 weeks gestation of pregnancy: Secondary | ICD-10-CM

## 2019-05-23 DIAGNOSIS — D6959 Other secondary thrombocytopenia: Secondary | ICD-10-CM | POA: Diagnosis not present

## 2019-05-23 DIAGNOSIS — Z1159 Encounter for screening for other viral diseases: Secondary | ICD-10-CM | POA: Diagnosis not present

## 2019-05-23 DIAGNOSIS — O26893 Other specified pregnancy related conditions, third trimester: Secondary | ICD-10-CM | POA: Diagnosis present

## 2019-05-23 DIAGNOSIS — Z9851 Tubal ligation status: Secondary | ICD-10-CM

## 2019-05-23 LAB — CBC
HCT: 36 % (ref 36.0–46.0)
Hemoglobin: 11.9 g/dL — ABNORMAL LOW (ref 12.0–15.0)
MCH: 29.1 pg (ref 26.0–34.0)
MCHC: 33.1 g/dL (ref 30.0–36.0)
MCV: 88 fL (ref 80.0–100.0)
Platelets: 152 10*3/uL (ref 150–400)
RBC: 4.09 MIL/uL (ref 3.87–5.11)
RDW: 13.4 % (ref 11.5–15.5)
WBC: 11.2 10*3/uL — ABNORMAL HIGH (ref 4.0–10.5)
nRBC: 0 % (ref 0.0–0.2)

## 2019-05-23 LAB — TYPE AND SCREEN
ABO/RH(D): A POS
Antibody Screen: NEGATIVE

## 2019-05-23 LAB — ABO/RH: ABO/RH(D): A POS

## 2019-05-23 LAB — SARS CORONAVIRUS 2 BY RT PCR (HOSPITAL ORDER, PERFORMED IN ~~LOC~~ HOSPITAL LAB): SARS Coronavirus 2: NEGATIVE

## 2019-05-23 MED ORDER — DIPHENHYDRAMINE HCL 50 MG/ML IJ SOLN: 12.5000 mg | INTRAMUSCULAR | Status: DC | PRN

## 2019-05-23 MED ORDER — LIDOCAINE HCL (PF) 1 % IJ SOLN: 30.0000 mL | INTRAMUSCULAR | Status: DC | PRN

## 2019-05-23 MED ORDER — PHENYLEPHRINE 40 MCG/ML (10ML) SYRINGE FOR IV PUSH (FOR BLOOD PRESSURE SUPPORT)
80.0000 ug | PREFILLED_SYRINGE | INTRAVENOUS | Status: DC | PRN
  Filled 2019-05-23: qty 10

## 2019-05-23 MED ORDER — ACETAMINOPHEN 325 MG PO TABS: 650.0000 mg | ORAL_TABLET | ORAL | Status: DC | PRN

## 2019-05-23 MED ORDER — EPHEDRINE 5 MG/ML INJ: 10.0000 mg | INTRAVENOUS | Status: DC | PRN

## 2019-05-23 MED ORDER — SOD CITRATE-CITRIC ACID 500-334 MG/5ML PO SOLN: 30.0000 mL | ORAL | Status: DC | PRN

## 2019-05-23 MED ORDER — LIDOCAINE HCL (PF) 1 % IJ SOLN
INTRAMUSCULAR | Status: DC | PRN
  Administered 2019-05-23: 5 mL via EPIDURAL

## 2019-05-23 MED ORDER — PHENYLEPHRINE 40 MCG/ML (10ML) SYRINGE FOR IV PUSH (FOR BLOOD PRESSURE SUPPORT): 80.0000 ug | PREFILLED_SYRINGE | INTRAVENOUS | Status: DC | PRN

## 2019-05-23 MED ORDER — LACTATED RINGERS IV SOLN
500.0000 mL | INTRAVENOUS | Status: DC | PRN
  Administered 2019-05-23: 1000 mL via INTRAVENOUS

## 2019-05-23 MED ORDER — SODIUM CHLORIDE (PF) 0.9 % IJ SOLN
INTRAMUSCULAR | Status: DC | PRN
  Administered 2019-05-23: 12 mL/h via EPIDURAL

## 2019-05-23 MED ORDER — ONDANSETRON HCL 4 MG/2ML IJ SOLN
4.0000 mg | Freq: Four times a day (QID) | INTRAMUSCULAR | Status: DC | PRN
  Administered 2019-05-23: 4 mg via INTRAVENOUS
  Filled 2019-05-23: qty 2

## 2019-05-23 MED ORDER — OXYCODONE-ACETAMINOPHEN 5-325 MG PO TABS: 2.0000 | ORAL_TABLET | ORAL | Status: DC | PRN

## 2019-05-23 MED ORDER — OXYCODONE-ACETAMINOPHEN 5-325 MG PO TABS
1.0000 | ORAL_TABLET | ORAL | Status: DC | PRN
  Administered 2019-05-23: 1 via ORAL
  Filled 2019-05-23: qty 1

## 2019-05-23 MED ORDER — OXYTOCIN 40 UNITS IN NORMAL SALINE INFUSION - SIMPLE MED
2.5000 [IU]/h | INTRAVENOUS | Status: DC
  Filled 2019-05-23: qty 1000

## 2019-05-23 MED ORDER — LACTATED RINGERS IV SOLN
INTRAVENOUS | Status: DC
  Administered 2019-05-23 (×2): via INTRAVENOUS

## 2019-05-23 MED ORDER — OXYTOCIN BOLUS FROM INFUSION
500.0000 mL | Freq: Once | INTRAVENOUS | Status: AC
  Administered 2019-05-23: 500 mL via INTRAVENOUS

## 2019-05-23 MED ORDER — FENTANYL-BUPIVACAINE-NACL 0.5-0.125-0.9 MG/250ML-% EP SOLN
12.0000 mL/h | EPIDURAL | Status: DC | PRN
  Filled 2019-05-23: qty 250

## 2019-05-23 MED ORDER — LACTATED RINGERS IV SOLN: 500.0000 mL | Freq: Once | INTRAVENOUS | Status: DC

## 2019-05-23 NOTE — Progress Notes (Signed)
Patient ID: Peggy [name], female   DOB: 07-27-1986, 33 y.o.   MRN: [id number] Peggy [name] is a 33 y.o. [phone] at [redacted]w[redacted]d admitted for active labor  Subjective: Comfortable w/ epidural, wants water broken  Objective: BP 118/76   Pulse 92   Temp 98 F (36.7 C) (Oral)   Resp 16   Ht 4\' 11"  (1.499 m)   Wt 62.4 kg   LMP 09/02/2018   SpO2 100%   BMI 27.79 kg/m  No intake/output data recorded.  FHT:  FHR: 120 bpm, variability: moderate,  accelerations:  Present,  decelerations:  Absent UC:   q 2-16mins  SVE:  5/80/-2 AROM clear fluid  Labs: Lab Results  Component Value Date   WBC 11.2 (H) 05/23/2019   HGB 11.9 (L) 05/23/2019   HCT 36.0 05/23/2019   MCV 88.0 05/23/2019   PLT 152 05/23/2019    Assessment / Plan: Spontaneous labor, now AROM'd  Labor: Progressing normally Fetal Wellbeing:  Category I Pain Control:  Epidural Pre-eclampsia: n/a I/D:  GBS neg Anticipated MOD:  NSVD  Cheral Marker CNM, WHNP-BC 05/23/2019, 7:21 PM

## 2019-05-23 NOTE — H&P (Signed)
OBSTETRIC ADMISSION HISTORY AND PHYSICAL  Peggy [name] is a 33 y.o. female [phone] with IUP at [redacted]w[redacted]d presenting for active labor, contractions.  She reports +FMs. No LOF, VB, blurry vision, headaches, peripheral edema, or RUQ pain. She plans on breast/bottle feeding. She requests BTL for birth control.  Dating: By 6 week scan --->  Estimated Date of Delivery: 05/30/19  Sono:    [redacted]w[redacted]d, CWD, normal anatomy, presentation cephalic, 2675g, 27%OJJ, EFW 5 lb 14 oz   Prenatal History/Complications: Previous PTD X 3 Gestational thrombocytopenia  Gastroschisis   Past Medical History: Past Medical History:  Diagnosis Date  . Allergies   . Anemia   . History of gastroschisis 01/06/2019    Past Surgical History: Past Surgical History:  Procedure Laterality Date  . GASTROSCHISIS CLOSURE      Obstetrical History: OB History    Gravida  4   Para  3   Term      Preterm  3   AB      Living  3     SAB      TAB      Ectopic      Multiple      Live Births  3           Social History: Social History   Socioeconomic History  . Marital status: Widowed    Spouse name: Not on file  . Number of children: Not on file  . Years of education: Not on file  . Highest education level: Not on file  Occupational History  . Not on file  Social Needs  . Financial resource strain: Not on file  . Food insecurity:    Worry: Not on file    Inability: Not on file  . Transportation needs:    Medical: Not on file    Non-medical: Not on file  Tobacco Use  . Smoking status: Former Smoker    Last attempt to quit: 08/31/2018    Years since quitting: 0.7  . Smokeless tobacco: Never Used  Substance and Sexual Activity  . Alcohol use: Not Currently  . Drug use: Not Currently  . Sexual activity: Not Currently  Lifestyle  . Physical activity:    Days per week: Not on file    Minutes per session: Not on file  . Stress: Not on file  Relationships  . Social connections:    Talks on  phone: Not on file    Gets together: Not on file    Attends religious service: Not on file    Active member of club or organization: Not on file    Attends meetings of clubs or organizations: Not on file    Relationship status: Not on file  Other Topics Concern  . Not on file  Social History Narrative  . Not on file    Family History: Family History  Problem Relation Age of Onset  . Hypertension Mother   . Arthritis Father   . Cancer Sister     Allergies: No Known Allergies  Medications Prior to Admission  Medication Sig Dispense Refill Last Dose  . acetaminophen (TYLENOL) 325 MG tablet Take 650 mg by mouth every 6 (six) hours as needed for moderate pain or headache.   Taking  . albuterol (PROVENTIL HFA;VENTOLIN HFA) 108 (90 Base) MCG/ACT inhaler Inhale 1-2 puffs into the lungs every 6 (six) hours as needed for wheezing or shortness of breath. 1 Inhaler 2 Taking  . cetirizine (ZYRTEC) 10 MG tablet Take 1 tablet (  10 mg total) by mouth daily. 30 tablet 5 Taking  . NIFEdipine (PROCARDIA-XL/NIFEDICAL-XL) 30 MG 24 hr tablet Take 1 tablet (30 mg total) by mouth daily. Can increase to twice a day as needed for symptomatic contractions 60 tablet 2 Taking  . Prenatal Multivit-Min-Fe-FA (PRENATAL VITAMINS) 0.8 MG tablet Take 1 tablet by mouth daily. 30 tablet 12 Taking    Review of Systems   All systems reviewed and negative except as stated in HPI  Blood pressure 126/81, pulse 97, temperature (!) 97.5 F (36.4 C), temperature source Oral, resp. rate 20, height 4\' 11"  (1.499 m), weight 62.4 kg, last menstrual period 09/02/2018, SpO2 100 %. General appearance: alert and cooperative Lungs: regular rate and effort Heart: regular rate  Abdomen: soft, non-tender Extremities: Homans sign is negative, no sign of DVT Presentation: cephalic Fetal monitoringBaseline: 135 bpm, Variability: Good {> 6 bpm), Accelerations: Reactive and Decelerations: Absent Uterine activity: 2-4 mins apart.   Dilation: 6 Effacement (%): 70 Station: -3 Exam by:: J Rasch NP   Prenatal labs: ABO, Rh: A/Positive/-- (01/09 1015) Antibody: Negative (01/09 1019) Rubella: Immune (12/05 0000) RPR: Non Reactive (03/06 0807)  HBsAg: Negative (12/05 0000)  HIV: Non Reactive (03/06 0807)  GBS: Negative (05/07 1052)  2 hr GTT Normal   Prenatal Transfer Tool  Maternal Diabetes: No Genetic Screening: Normal Maternal Ultrasounds/Referrals: Normal  HC between 2-3 SD Fetal Ultrasounds or other Referrals:  None Maternal Substance Abuse:  No Significant Maternal Medications:  None Significant Maternal Lab Results: None  No results found for this or any previous visit (from the past 24 hour(s)).  Patient Active Problem List   Diagnosis Date Noted  . Indication for care in labor and delivery, antepartum 05/23/2019  . Small fetal head circumference on ultrasound @ 36wks 05/09/2019  . Fundal height low for dates, third trimester 05/05/2019  . Gestational thrombocytopenia (HCC) 04/14/2019  . Asthma affecting pregnancy in third trimester 01/13/2019  . Supervision of high risk pregnancy, antepartum 01/06/2019  . History of preterm delivery, currently pregnant 01/06/2019  . History of gastroschisis 01/06/2019    Assessment: Peggy [name] is a 33 y.o. [phone] at 11036w0d here for active labor, contractions   1. Labor: Active  2. FWB: Category 1 3. Pain: Desires epidural  4. GBS: Negative    Plan:  Admit to labor and delivery PP tubal desired: Consent signed  Joellyn HaffKim Booker CNM aware    Venia CarbonJennifer Rasch, NP  05/23/2019, 4:27 PM

## 2019-05-23 NOTE — Anesthesia Preprocedure Evaluation (Signed)
Anesthesia Evaluation  Patient identified by MRN, date of birth, ID band Patient awake    Reviewed: Allergy & Precautions, NPO status , Patient's Chart, lab work & pertinent test results  Airway Mallampati: II  TM Distance: >3 FB Neck ROM: Full    Dental no notable dental hx.    Pulmonary asthma , former smoker,    Pulmonary exam normal breath sounds clear to auscultation       Cardiovascular Exercise Tolerance: Good negative cardio ROS Normal cardiovascular exam Rhythm:Regular Rate:Normal     Neuro/Psych    GI/Hepatic negative GI ROS, Neg liver ROS,   Endo/Other    Renal/GU negative Renal ROS     Musculoskeletal   Abdominal   Peds  Hematology Hgb 11.9 Plt 152   Anesthesia Other Findings   Reproductive/Obstetrics (+) Pregnancy                             Anesthesia Physical Anesthesia Plan  ASA: II  Anesthesia Plan: Epidural   Post-op Pain Management:    Induction:   PONV Risk Score and Plan:   Airway Management Planned:   Additional Equipment:   Intra-op Plan:   Post-operative Plan:   Informed Consent: I have reviewed the patients History and Physical, chart, labs and discussed the procedure including the risks, benefits and alternatives for the proposed anesthesia with the patient or authorized representative who has indicated his/her understanding and acceptance.       Plan Discussed with:   Anesthesia Plan Comments:         Anesthesia Quick Evaluation

## 2019-05-23 NOTE — Anesthesia Procedure Notes (Signed)
Epidural Patient location during procedure: OB Start time: 05/23/2019 5:11 PM End time: 05/23/2019 5:25 PM  Staffing Anesthesiologist: Trevor Iha, MD Performed: anesthesiologist   Preanesthetic Checklist Completed: patient identified, site marked, surgical consent, pre-op evaluation, timeout performed, IV checked, risks and benefits discussed and monitors and equipment checked  Epidural Patient position: sitting Prep: site prepped and draped and DuraPrep Patient monitoring: continuous pulse ox and blood pressure Approach: midline Location: L3-L4 Injection technique: LOR air  Needle:  Needle type: Tuohy  Needle gauge: 17 G Needle length: 9 cm and 9 Needle insertion depth: 6 cm Catheter type: closed end flexible Catheter size: 19 Gauge Catheter at skin depth: 11 cm Test dose: negative  Assessment Events: blood not aspirated, injection not painful, no injection resistance, negative IV test and no paresthesia  Additional Notes Patient identified. Risks/Benefits/Options discussed with patient including but not limited to bleeding, infection, nerve damage, paralysis, failed block, incomplete pain control, headache, blood pressure changes, nausea, vomiting, reactions to medication both or allergic, itching and postpartum back pain. Confirmed with bedside nurse the patient's most recent platelet count. Confirmed with patient that they are not currently taking any anticoagulation, have any bleeding history or any family history of bleeding disorders. Patient expressed understanding and wished to proceed. All questions were answered. Sterile technique was used throughout the entire procedure. Please see nursing notes for vital signs. Test dose was given through epidural needle and negative prior to continuing to dose epidural or start infusion. Warning signs of high block given to the patient including shortness of breath, tingling/numbness in hands, complete motor block, or any  concerning symptoms with instructions to call for help. Patient was given instructions on fall risk and not to get out of bed. All questions and concerns addressed with instructions to call with any issues. 1 Attempt (S) . Patient tolerated procedure well.

## 2019-05-23 NOTE — MAU Provider Note (Addendum)
None    S: Ms. Shamari Norred is a 33 y.o. [phone] at [redacted]w[redacted]d  who presents to MAU today complaining contractions.  She denies vaginal bleeding. She denies LOF. She reports normal fetal movement.    O: BP 126/81 (BP Location: Right Arm)   Pulse 97   Temp (!) 97.5 F (36.4 C) (Oral)   Resp 20   Ht 4\' 11"  (1.499 m)   Wt 62.4 kg   LMP 09/02/2018   SpO2 100%   BMI 27.79 kg/m  GENERAL: Well-developed, well-nourished female in no acute distress.  HEAD: Normocephalic, atraumatic.  CHEST: Normal effort of breathing, regular heart rate ABDOMEN: Soft, nontender, gravid  Cervical exam:  Dilation: 6 Effacement (%): 70 Station: -3 Presentation: Vertex Exam by:: J Zaeda Mcferran NP   Fetal Monitoring: Baseline: 135 bpm Variability: Moderate  Accelerations: 15x15 Decelerations: None Contractions: 2 contractions noted    A: SIUP at [redacted]w[redacted]d  Active labor  P: Admit to labor and delivery. Patient requests epidural.  GBS negative Vertex position confirmed with Bedside US   Shianne Zeiser, Harolyn Rutherford, NP 05/23/2019 4:09 PM

## 2019-05-23 NOTE — MAU Note (Signed)
Pt having ctx every 9 minutes apart, started at 12pm. No LOF or bleeding. +FM

## 2019-05-23 NOTE — MAU Provider Note (Signed)
Pt informed that the ultrasound is considered a limited OB ultrasound and is not intended to be a complete ultrasound exam.  Patient also informed that the ultrasound is not being completed with the intent of assessing for fetal or placental anomalies or any pelvic abnormalities.  Explained that the purpose of today's ultrasound is to assess for  presentation.  Patient acknowledges the purpose of the exam and the limitations of the study.    Vertex position.  Duane Lope, NP 05/23/2019 4:19 PM

## 2019-05-24 ENCOUNTER — Encounter (HOSPITAL_COMMUNITY)
Admission: AD | Disposition: A | Discharge status: Home / Self Care | Payer: Self-pay | Source: Home / Self Care | Attending: Obstetrics & Gynecology

## 2019-05-24 ENCOUNTER — Inpatient Hospital Stay (HOSPITAL_COMMUNITY): Payer: Medicaid Other

## 2019-05-24 DIAGNOSIS — Z302 Encounter for sterilization: Secondary | ICD-10-CM

## 2019-05-24 DIAGNOSIS — Z3A39 39 weeks gestation of pregnancy: Secondary | ICD-10-CM

## 2019-05-24 HISTORY — PX: TUBAL LIGATION: SHX77

## 2019-05-24 SURGERY — LIGATION, FALLOPIAN TUBE, POSTPARTUM
Anesthesia: Epidural | Laterality: Bilateral | Wound class: Clean Contaminated

## 2019-05-24 MED ORDER — SODIUM BICARBONATE 8.4 % IV SOLN
INTRAVENOUS | Status: AC
  Filled 2019-05-24: qty 50

## 2019-05-24 MED ORDER — BENZOCAINE-MENTHOL 20-0.5 % EX AERO: 1.0000 "application " | INHALATION_SPRAY | CUTANEOUS | Status: DC | PRN

## 2019-05-24 MED ORDER — SODIUM CHLORIDE 0.9 % IR SOLN
Status: DC | PRN
  Administered 2019-05-24: 1

## 2019-05-24 MED ORDER — LIDOCAINE-EPINEPHRINE (PF) 2 %-1:200000 IJ SOLN
INTRAMUSCULAR | Status: AC
  Filled 2019-05-24: qty 20

## 2019-05-24 MED ORDER — DIPHENHYDRAMINE HCL 25 MG PO CAPS: 25.0000 mg | ORAL_CAPSULE | Freq: Four times a day (QID) | ORAL | Status: DC | PRN

## 2019-05-24 MED ORDER — PRENATAL MULTIVITAMIN CH: 1.0000 | ORAL_TABLET | Freq: Every day | ORAL | Status: DC

## 2019-05-24 MED ORDER — OXYCODONE HCL 5 MG PO TABS
5.0000 mg | ORAL_TABLET | Freq: Once | ORAL | Status: AC
  Administered 2019-05-24: 5 mg via ORAL

## 2019-05-24 MED ORDER — FENTANYL CITRATE (PF) 100 MCG/2ML IJ SOLN
25.0000 ug | INTRAMUSCULAR | Status: DC | PRN
  Administered 2019-05-24 (×2): 50 ug via INTRAVENOUS

## 2019-05-24 MED ORDER — BUPIVACAINE HCL (PF) 0.25 % IJ SOLN
INTRAMUSCULAR | Status: AC
  Filled 2019-05-24: qty 30

## 2019-05-24 MED ORDER — COCONUT OIL OIL: 1.0000 "application " | TOPICAL_OIL | Status: DC | PRN

## 2019-05-24 MED ORDER — DOCUSATE SODIUM 100 MG PO CAPS
100.0000 mg | ORAL_CAPSULE | Freq: Two times a day (BID) | ORAL | Status: DC
  Administered 2019-05-24 – 2019-05-25 (×3): 100 mg via ORAL
  Filled 2019-05-24 (×3): qty 1

## 2019-05-24 MED ORDER — PROPOFOL 10 MG/ML IV BOLUS
INTRAVENOUS | Status: AC
  Filled 2019-05-24: qty 20

## 2019-05-24 MED ORDER — OXYCODONE HCL 5 MG/5ML PO SOLN: 5.0000 mg | Freq: Once | ORAL | Status: AC | PRN

## 2019-05-24 MED ORDER — ONDANSETRON HCL 4 MG/2ML IJ SOLN: 4.0000 mg | Freq: Once | INTRAMUSCULAR | Status: DC | PRN

## 2019-05-24 MED ORDER — LACTATED RINGERS IV SOLN
INTRAVENOUS | Status: DC
  Administered 2019-05-24: 11:00:00 via INTRAVENOUS

## 2019-05-24 MED ORDER — ONDANSETRON HCL 4 MG/2ML IJ SOLN: 4.0000 mg | INTRAMUSCULAR | Status: DC | PRN

## 2019-05-24 MED ORDER — TETANUS-DIPHTH-ACELL PERTUSSIS 5-2.5-18.5 LF-MCG/0.5 IM SUSP: 0.5000 mL | Freq: Once | INTRAMUSCULAR | Status: DC

## 2019-05-24 MED ORDER — ZOLPIDEM TARTRATE 5 MG PO TABS: 5.0000 mg | ORAL_TABLET | Freq: Every evening | ORAL | Status: DC | PRN

## 2019-05-24 MED ORDER — SIMETHICONE 80 MG PO CHEW: 80.0000 mg | CHEWABLE_TABLET | ORAL | Status: DC | PRN

## 2019-05-24 MED ORDER — IBUPROFEN 800 MG PO TABS
800.0000 mg | ORAL_TABLET | Freq: Three times a day (TID) | ORAL | Status: DC
  Administered 2019-05-24 – 2019-05-25 (×4): 800 mg via ORAL
  Filled 2019-05-24 (×4): qty 1

## 2019-05-24 MED ORDER — WITCH HAZEL-GLYCERIN EX PADS
1.0000 "application " | MEDICATED_PAD | CUTANEOUS | Status: DC | PRN
  Administered 2019-05-25: 1 via TOPICAL

## 2019-05-24 MED ORDER — FAMOTIDINE 20 MG PO TABS
40.0000 mg | ORAL_TABLET | Freq: Once | ORAL | Status: AC
  Administered 2019-05-24: 40 mg via ORAL
  Filled 2019-05-24: qty 2

## 2019-05-24 MED ORDER — OXYCODONE HCL 5 MG PO TABS
5.0000 mg | ORAL_TABLET | ORAL | Status: DC | PRN
  Administered 2019-05-24 – 2019-05-25 (×4): 5 mg via ORAL
  Filled 2019-05-24 (×6): qty 1

## 2019-05-24 MED ORDER — ONDANSETRON HCL 4 MG PO TABS: 4.0000 mg | ORAL_TABLET | ORAL | Status: DC | PRN

## 2019-05-24 MED ORDER — BUPIVACAINE HCL (PF) 0.25 % IJ SOLN
INTRAMUSCULAR | Status: DC | PRN
  Administered 2019-05-24: 30 mL

## 2019-05-24 MED ORDER — ACETAMINOPHEN 325 MG PO TABS
650.0000 mg | ORAL_TABLET | ORAL | Status: DC | PRN
  Administered 2019-05-24 – 2019-05-25 (×5): 650 mg via ORAL
  Filled 2019-05-24 (×6): qty 2

## 2019-05-24 MED ORDER — FENTANYL CITRATE (PF) 100 MCG/2ML IJ SOLN
INTRAMUSCULAR | Status: AC
  Filled 2019-05-24: qty 2

## 2019-05-24 MED ORDER — OXYCODONE HCL 5 MG PO TABS
5.0000 mg | ORAL_TABLET | Freq: Once | ORAL | Status: AC | PRN
  Administered 2019-05-24: 5 mg via ORAL

## 2019-05-24 MED ORDER — METOCLOPRAMIDE HCL 10 MG PO TABS
10.0000 mg | ORAL_TABLET | Freq: Once | ORAL | Status: AC
  Administered 2019-05-24: 10 mg via ORAL
  Filled 2019-05-24: qty 1

## 2019-05-24 MED ORDER — SODIUM BICARBONATE 8.4 % IV SOLN
INTRAVENOUS | Status: DC | PRN
  Administered 2019-05-24 (×2): 4 mL via EPIDURAL
  Administered 2019-05-24: 2 mL via EPIDURAL
  Administered 2019-05-24: 7 mL via EPIDURAL
  Administered 2019-05-24: 3 mL via EPIDURAL

## 2019-05-24 MED ORDER — DIBUCAINE (PERIANAL) 1 % EX OINT: 1.0000 "application " | TOPICAL_OINTMENT | CUTANEOUS | Status: DC | PRN

## 2019-05-24 MED ORDER — MEASLES, MUMPS & RUBELLA VAC IJ SOLR: 0.5000 mL | Freq: Once | INTRAMUSCULAR | Status: DC

## 2019-05-24 SURGICAL SUPPLY — 21 items
BLADE SURG 11 STRL SS (BLADE) ×2 IMPLANT
CLIP FILSHIE TUBAL LIGA STRL (Clip) ×2 IMPLANT
DRSG OPSITE POSTOP 3X4 (GAUZE/BANDAGES/DRESSINGS) ×2 IMPLANT
DURAPREP 26ML APPLICATOR (WOUND CARE) ×2 IMPLANT
GLOVE BIOGEL PI IND STRL 7.0 (GLOVE) ×1 IMPLANT
GLOVE BIOGEL PI IND STRL 7.5 (GLOVE) ×1 IMPLANT
GLOVE BIOGEL PI INDICATOR 7.0 (GLOVE) ×1
GLOVE BIOGEL PI INDICATOR 7.5 (GLOVE) ×1
GLOVE ECLIPSE 7.5 STRL STRAW (GLOVE) ×2 IMPLANT
GOWN STRL REUS W/TWL LRG LVL3 (GOWN DISPOSABLE) ×4 IMPLANT
HIBICLENS CHG 4% 4OZ BTL (MISCELLANEOUS) ×2 IMPLANT
NEEDLE HYPO 22GX1.5 SAFETY (NEEDLE) ×2 IMPLANT
NS IRRIG 1000ML POUR BTL (IV SOLUTION) ×2 IMPLANT
PACK ABDOMINAL MINOR (CUSTOM PROCEDURE TRAY) ×2 IMPLANT
PROTECTOR NERVE ULNAR (MISCELLANEOUS) ×2 IMPLANT
SPONGE LAP 4X18 RFD (DISPOSABLE) IMPLANT
SUT VICRYL 0 UR6 27IN ABS (SUTURE) ×2 IMPLANT
SUT VICRYL 4-0 PS2 18IN ABS (SUTURE) ×2 IMPLANT
SYR CONTROL 10ML LL (SYRINGE) ×2 IMPLANT
TOWEL OR 17X24 6PK STRL BLUE (TOWEL DISPOSABLE) ×4 IMPLANT
TRAY FOLEY W/BAG SLVR 14FR (SET/KITS/TRAYS/PACK) ×2 IMPLANT

## 2019-05-24 NOTE — Anesthesia Preprocedure Evaluation (Signed)
Anesthesia Evaluation  Patient identified by MRN, date of birth, ID band Patient awake    Reviewed: Allergy & Precautions, NPO status , Patient's Chart, lab work & pertinent test results  Airway Mallampati: II  TM Distance: >3 FB Neck ROM: Full    Dental no notable dental hx.    Pulmonary asthma , former smoker,    Pulmonary exam normal breath sounds clear to auscultation       Cardiovascular Exercise Tolerance: Good negative cardio ROS Normal cardiovascular exam Rhythm:Regular Rate:Normal     Neuro/Psych    GI/Hepatic negative GI ROS, Neg liver ROS,   Endo/Other    Renal/GU negative Renal ROS     Musculoskeletal   Abdominal   Peds  Hematology Hgb 11.9 Plt 152   Anesthesia Other Findings   Reproductive/Obstetrics (+) Pregnancy                             Anesthesia Physical  Anesthesia Plan  ASA: II  Anesthesia Plan: Epidural   Post-op Pain Management:    Induction:   PONV Risk Score and Plan: 3 and Ondansetron, Dexamethasone and Treatment may vary due to age or medical condition  Airway Management Planned: Natural Airway  Additional Equipment: None  Intra-op Plan:   Post-operative Plan:   Informed Consent: I have reviewed the patients History and Physical, chart, labs and discussed the procedure including the risks, benefits and alternatives for the proposed anesthesia with the patient or authorized representative who has indicated his/her understanding and acceptance.       Plan Discussed with:   Anesthesia Plan Comments:         Anesthesia Quick Evaluation

## 2019-05-24 NOTE — Progress Notes (Signed)
CSW went to speak with MOB at bedside regarding Edinburgh Score of 14. CSW notified by RN that MOB is currently cleaning self up and was asked to follow back up in 125-20 minutes. CSW will attempt to follow up at that time.     Joneric Streight S. Lulabelle Desta, MSW, LCSW-A [facility] at   [phone]  

## 2019-05-24 NOTE — Progress Notes (Signed)
CSW received consult due to score 14 on Edinburgh Depression Screen.    CSW went to speak with MOB at bedside. Upon entering the room CSW  observed that MOB was sitting up in bed however appeared to be in a lot of pain, CSW asked MOB if she would like for CSW to come back at another time and MOB requested that CSW stay because MOB would be leaving soon for procedure. CSW understanding and introduced role to MOB. CSW advised MOB of the reason for the visit. MOB reported that she only feels anxiety and depression after she has given birth. CSW asked MOB if shew as having any signs and symptoms at this time and MOB reported "NO! I'm just in a lot of pain". CSW understanding and proceeded to with assessment a little quicker. MOB reported that she has three other children and that she has PPD with her lat one which was a year ago. MOB reported that she has been feeling fine since giving birth and that she isn't having any anxiety and depression at this time again just in a lot of pain. CSW notified that infant will sleep in basinet once home and that MOB is fine and would like RN to know that she is in pain. CSW expressed that CSW would alert MOB's RN of this for further needs.   CSW provided education regarding Baby Blues vs PMADs and provided MOB with resources for mental health follow up.  CSW encouraged MOB to evaluate her mental health throughout the postpartum period with the use of the New Mom Checklist developed by [employer]partum [employer] as well as the Edinburgh Postnatal Depression Scale and notify a medical professional if symptoms arise.        Peggy [name] S. Peggy [name], MSW, LCSW-A [facility] at [place] [phone]  

## 2019-05-24 NOTE — Anesthesia Postprocedure Evaluation (Signed)
Anesthesia Post Note  Patient: Peggy [name]  Procedure(s) Performed: AN AD HOC LABOR EPIDURAL     Patient location during evaluation: Mother Baby Anesthesia Type: Epidural Level of consciousness: awake and alert Pain management: pain level controlled Vital Signs Assessment: post-procedure vital signs reviewed and stable Respiratory status: spontaneous breathing, nonlabored ventilation and respiratory function stable Cardiovascular status: stable Postop Assessment: no headache, no backache, epidural receding, no apparent nausea or vomiting, patient able to bend at knees, able to ambulate and adequate PO intake Anesthetic complications: no    Last Vitals:  Vitals:   05/24/19 0514 05/24/19 0855  BP: 116/80 108/71  Pulse: 81 73  Resp: 18 18  Temp: 36.8 C 36.7 C  SpO2: 100% 100%    Last Pain:  Vitals:   05/24/19 0855  TempSrc: Oral  PainSc: 6    Pain Goal: Patients Stated Pain Goal: 3 (05/24/19 0855)                 Laban Emperor

## 2019-05-24 NOTE — Transfer of Care (Signed)
Immediate Anesthesia Transfer of Care Note  Patient: Peggy [name]  Procedure(s) Performed: POST PARTUM TUBAL LIGATION (Bilateral )  Patient Location: PACU  Anesthesia Type:Epidural  Level of Consciousness: awake, alert  and oriented  Airway & Oxygen Therapy: Patient Spontanous Breathing  Post-op Assessment: Report given to RN and Post -op Vital signs reviewed and stable  Post vital signs: Reviewed and stable  Last Vitals:  Vitals Value Taken Time  BP 101/55 05/24/2019 12:31 PM  Temp    Pulse 73 05/24/2019 12:35 PM  Resp 13 05/24/2019 12:35 PM  SpO2 100 % 05/24/2019 12:35 PM  Vitals shown include unvalidated device data.  Last Pain:  Vitals:   05/24/19 0855  TempSrc: Oral  PainSc: 6       Patients Stated Pain Goal: 3 (05/24/19 0855)  Complications: No apparent anesthesia complications

## 2019-05-24 NOTE — Anesthesia Postprocedure Evaluation (Signed)
Anesthesia Post Note  Patient: [name]  Procedure(s) Performed: POST PARTUM TUBAL LIGATION (Bilateral )     Patient location during evaluation: Mother Baby Anesthesia Type: Epidural Level of consciousness: awake and alert and oriented Pain management: satisfactory to patient Vital Signs Assessment: post-procedure vital signs reviewed and stable Respiratory status: spontaneous breathing and nonlabored ventilation Cardiovascular status: stable Postop Assessment: no headache, no backache, no signs of nausea or vomiting, adequate PO intake, patient able to bend at knees, no apparent nausea or vomiting and able to ambulate (patient up walking) Anesthetic complications: no    Last Vitals:  Vitals:   05/24/19 1345 05/24/19 1400  BP: 112/65 105/68  Pulse:    Resp: 10 13  Temp:    SpO2:      Last Pain:  Vitals:   05/24/19 1417  TempSrc:   PainSc: 7    Pain Goal: Patients Stated Pain Goal: 3 (05/24/19 0855)                 [name]

## 2019-05-24 NOTE — Op Note (Signed)
Peggy [name] 05/24/2019  PREOPERATIVE DIAGNOSIS:  Undesired fertility  POSTOPERATIVE DIAGNOSIS:  Undesired fertility  PROCEDURE:  Postpartum Bilateral Tubal Sterilization using Filshie Clips   SURGEON: Surgeon(s) and Role:    * Levie Heritage, DO - Primary    * Arvilla Market, DO - Fellow   ANESTHESIA:  Epidural  COMPLICATIONS:  None immediate.  ESTIMATED BLOOD LOSS:  Minimal   FLUIDS: 600 cc LR.  URINE OUTPUT:  0 cc of clear urine.  INDICATIONS: 33 y.o. yo [id number]  with undesired fertility,status post vaginal delivery, desires permanent sterilization. Risks and benefits of procedure discussed with patient including permanence of method, bleeding, infection, injury to surrounding organs and need for additional procedures. Risk failure of 0.5-1% with increased risk of ectopic gestation if pregnancy occurs was also discussed with patient.   FINDINGS:  Normal uterus, tubes, and ovaries.  TECHNIQUE:  The patient was taken to the operating room where her epidural anesthesia was dosed up to surgical level and found to be adequate.  She was then placed in the dorsal supine position and prepped and draped in sterile fashion.  After an adequate timeout was performed, injected 20 cc of local analgesia at the umbilicus. Attention was turned to the patient's abdomen where a small transverse skin incision was made under the umbilical fold. The incision was taken down to the layer of fascia using the scalpel, and fascia was incised, and extended bilaterally using Mayo scissors. The peritoneum was entered in a sharp fashion. Attention was then turned to the patient's uterus, and left fallopian tube was identified and followed out to the fimbriated end.  A Filshie clip was placed on the left fallopian tube about 2 cm from the cornual attachment, with care given to incorporate the underlying mesosalpinx.  A similar process was carried out on the rightl side allowing for bilateral tubal  sterilization.  Good hemostasis was noted overall.  Local analgesia was drizzled on both operative sites.The instruments were then removed from the patient's abdomen and the fascial incision was repaired with 0 Vicryl, and the skin was closed with a 3-0 Monocryl subcuticular stitch. An additional 10 cc of local analgesia was then injected. The patient tolerated the procedure well.  Sponge, lap, and needle counts were correct times two.  The patient was then taken to the recovery room awake, extubated and in stable condition.  Marcy Siren, D.O. OB FELLOW  05/25/2019, 9:14 AM

## 2019-05-24 NOTE — Discharge Summary (Signed)
Obstetrics Discharge Summary [facility]   Patient Name: Peggy [name] DOB: 1986-01-06 MRN: [id number]  Date of admission: 05/23/2019 Delivering MD: Tamera Stands   Date of discharge: 05/25/2019  Admitting diagnosis: CTX 9 MIN  Intrauterine pregnancy: [redacted]w[redacted]d     Secondary diagnosis:   Active Problems:   Indication for care in labor and delivery, antepartum   Discharge diagnosis: Term Pregnancy Delivered                                            Postpartum procedures: BTL Complications: None  Outpatient Follow-Up: 4 weeks for PP visit at [facility] course: [name] is a 33 y.o. [redacted]w[redacted]d who was admitted for spontaneous onset of labor. Her pregnancy was uncomplicated. Her labor course was notable for epidural placement, AROM. Delivery was uncomplicated. Please see delivery/op note for additional details. She had a postpartum BTL on PPD#1. Her postpartum course was uncomplicated. She was bottle feeding without difficulty. By day of discharge, she was passing flatus, urinating, eating and drinking without difficulty. Her pain was well-controlled, and she was discharged home with Percocet and Ibuprofen. She will follow-up in clinic in 4 weeks.   Physical exam  Vitals:   05/24/19 2000 05/25/19 0000 05/25/19 0430 05/25/19 0825  BP: 122/65 (!) 126/54 108/69 106/65  Pulse: 68 70 67 61  Resp: 18 18 16 17   Temp: 97.8 F (36.6 C) 97.9 F (36.6 C) 97.7 F (36.5 C) 98.4 F (36.9 C)  TempSrc: Oral Oral Oral Oral  SpO2: 100% 100% 100%   Weight:      Height:       General: WDWN female in NAD Lochia: appropriate Uterine Fundus: firm Incision: Healing well with no significant drainage, No significant erythema, Dressing is clean, dry, and intact (BTL) DVT Evaluation: No evidence of DVT seen on physical exam. No cords or calf tenderness. No significant calf/ankle edema. Labs: Lab Results  Component Value Date   WBC 11.2 (H) 05/23/2019   HGB 11.9 (L) 05/23/2019   HCT 36.0 05/23/2019   MCV 88.0 05/23/2019   PLT 152 05/23/2019   CMP Latest Ref Rng & Units 01/22/2019  Glucose 70 - 99 mg/dL 92  BUN 6 - 20 mg/dL 7  Creatinine 6.80 - 3.21 mg/dL 2.24  Sodium 825 - 003 mmol/L 136  Potassium 3.5 - 5.1 mmol/L 3.7  Chloride 98 - 111 mmol/L 106  CO2 22 - 32 mmol/L 22  Calcium 8.9 - 10.3 mg/dL 7.0(W)  Total Protein 6.5 - 8.1 g/dL 6.4(L)  Total Bilirubin 0.3 - 1.2 mg/dL 0.5  Alkaline Phos 38 - 126 U/L 47  AST 15 - 41 U/L 17  ALT 0 - 44 U/L 8    Discharge instructions: Per After Visit Summary and "Baby and Me Booklet"  After visit meds:  Allergies as of 05/25/2019   No Known Allergies     Medication List    TAKE these medications   acetaminophen 325 MG tablet Commonly known as:  TYLENOL Take 650 mg by mouth every 6 (six) hours as needed for moderate pain or headache.   albuterol 108 (90 Base) MCG/ACT inhaler Commonly known as:  VENTOLIN HFA Inhale 1-2 puffs into the lungs every 6 (six) hours as needed for wheezing or shortness of breath.   cetirizine 10 MG tablet Commonly known as:  ZYRTEC Take 1 tablet (10 mg total) by  mouth daily.   docusate sodium 250 MG capsule Commonly known as:  COLACE Take 1 capsule (250 mg total) by mouth daily.   ibuprofen 800 MG tablet Commonly known as:  ADVIL Take 1 tablet (800 mg total) by mouth 3 (three) times daily.   oxyCODONE 5 MG immediate release tablet Commonly known as:  Oxy IR/ROXICODONE Take 1 tablet (5 mg total) by mouth every 4 (four) hours as needed for severe pain.   Prenatal Vitamins 0.8 MG tablet Take 1 tablet by mouth daily.       Postpartum contraception: Tubal Ligation Diet: Routine Diet Activity: Advance as tolerated. Pelvic rest for 6 weeks.   Follow-up Appt: Future Appointments  Date Time Provider Department Center  06/21/2019 10:00 AM Anyanwu, Jethro BastosUgonna A, MD [facility]   Follow-up Visit:No follow-ups on file. Please schedule this patient for Postpartum visit in:  4 weeks with the following provider: Any provider Low risk pregnancy complicated by: none Delivery mode:  SVD Anticipated Birth Control:  BTL done PP PP Procedures needed: none  Schedule Integrated BH visit: no  Newborn Data: Live born female  Birth Weight: 6 lb 6.5 oz (2906 g) APGAR: 8, 9  Newborn Delivery   Birth date/time:  05/23/2019 22:21:00 Delivery type:  Vaginal, Spontaneous     Baby Feeding: Bottle, Breast and planning to start breastfeeding on PPD#3 Disposition:home with mother   Kathlene CoteWenzel, Liviah Cake N, PA-C 05/25/2019 9:47 AM

## 2019-05-25 LAB — RPR: RPR Ser Ql: NONREACTIVE

## 2019-05-25 MED ORDER — OXYCODONE HCL 5 MG PO TABS: 5.0000 mg | ORAL_TABLET | ORAL | 0 refills | Status: DC | PRN

## 2019-05-25 MED ORDER — IBUPROFEN 800 MG PO TABS: 800.0000 mg | ORAL_TABLET | Freq: Three times a day (TID) | ORAL | 0 refills | Status: DC

## 2019-05-25 MED ORDER — DOCUSATE SODIUM 250 MG PO CAPS: 250.0000 mg | ORAL_CAPSULE | Freq: Every day | ORAL | 0 refills | Status: DC

## 2019-05-25 NOTE — Discharge Instructions (Signed)
Laparoscopic Tubal Ligation, Care After °Refer to this sheet in the next few weeks. These instructions provide you with information about caring for yourself after your procedure. Your health care provider may also give you more specific instructions. Your treatment has been planned according to current medical practices, but problems sometimes occur. Call your health care provider if you have any problems or questions after your procedure. °What can I expect after the procedure? °After the procedure, it is common to have: °· A sore throat. °· Discomfort in your shoulder. °· Mild discomfort or cramping in your abdomen. °· Gas pains. °· Pain or soreness in the area where the surgical cut (incision) was made. °· A bloated feeling. °· Tiredness. °· Nausea. °· Vomiting. °Follow these instructions at home: °Medicines °· Take over-the-counter and prescription medicines only as told by your health care provider. °· Do not take aspirin because it can cause bleeding. °· Do not drive or operate heavy machinery while taking prescription pain medicine. °Activity °· Rest for the rest of the day. °· Return to your normal activities as told by your health care provider. Ask your health care provider what activities are safe for you. °Incision care ° °  ° °· Follow instructions from your health care provider about how to take care of your incision. Make sure you: °? Wash your hands with soap and water before you change your bandage (dressing). If soap and water are not available, use hand sanitizer. °? Change your dressing as told by your health care provider. °? Leave stitches (sutures) in place. They may need to stay in place for 2 weeks or longer. °· Check your incision area every day for signs of infection. Check for: °? More redness, swelling, or pain. °? More fluid or blood. °? Warmth. °? Pus or a bad smell. °Other Instructions °· Do not take baths, swim, or use a hot tub until your health care provider approves. You may take  showers. °· Keep all follow-up visits as told by your health care provider. This is important. °· Have someone help you with your daily household tasks for the first few days. °Contact a health care provider if: °· You have more redness, swelling, or pain around your incision. °· Your incision feels warm to the touch. °· You have pus or a bad smell coming from your incision. °· The edges of your incision break open after the sutures have been removed. °· Your pain does not improve after 2-3 days. °· You have a rash. °· You repeatedly become dizzy or light-headed. °· Your pain medicine is not helping. °· You are constipated. °Get help right away if: °· You have a fever. °· You faint. °· You have increasing pain in your abdomen. °· You have severe pain in one or both of your shoulders. °· You have fluid or blood coming from your sutures or from your vagina. °· You have shortness of breath or difficulty breathing. °· You have chest pain or leg pain. °· You have ongoing nausea, vomiting, or diarrhea. °This information is not intended to replace advice given to you by your health care provider. Make sure you discuss any questions you have with your health care provider. °Document Released: 07/04/2005 Document Revised: 08/11/2017 Document Reviewed: 11/25/2015 °Elsevier Interactive Patient Education © 2019 [employer] °Vaginal Delivery, Care After °Refer to this sheet in the next few weeks. These instructions provide you with information about caring for yourself after vaginal delivery. Your health care provider may also give   you more specific instructions. Your treatment has been planned according to current medical practices, but problems sometimes occur. Call your health care provider if you have any problems or questions. °What can I expect after the procedure? °After vaginal delivery, it is common to have: °· Some bleeding from your vagina. °· Soreness in your abdomen, your vagina, and the area of skin between your  vaginal opening and your anus (perineum). °· Pelvic cramps. °· Fatigue. °Follow these instructions at home: °Medicines °· Take over-the-counter and prescription medicines only as told by your health care provider. °· If you were prescribed an antibiotic medicine, take it as told by your health care provider. Do not stop taking the antibiotic until it is finished. °Driving ° °· Do not drive or operate heavy machinery while taking prescription pain medicine. °· Do not drive for 24 hours if you received a sedative. °Lifestyle °· Do not drink alcohol. This is especially important if you are breastfeeding or taking medicine to relieve pain. °· Do not use tobacco products, including cigarettes, chewing tobacco, or e-cigarettes. If you need help quitting, ask your health care provider. °Eating and drinking °· Drink at least 8 eight-ounce glasses of water every day unless you are told not to by your health care provider. If you choose to breastfeed your baby, you may need to drink more water than this. °· Eat high-fiber foods every day. These foods may help prevent or relieve constipation. High-fiber foods include: °? Whole grain cereals and breads. °? Brown rice. °? Beans. °? Fresh fruits and vegetables. °Activity °· Return to your normal activities as told by your health care provider. Ask your health care provider what activities are safe for you. °· Rest as much as possible. Try to rest or take a nap when your baby is sleeping. °· Do not lift anything that is heavier than your baby or 10 lb (4.5 kg) until your health care provider says that it is safe. °· Talk with your health care provider about when you can engage in sexual activity. This may depend on your: °? Risk of infection. °? Rate of healing. °? Comfort and desire to engage in sexual activity. °Vaginal Care °· If you have an episiotomy or a vaginal tear, check the area every day for signs of infection. Check for: °? More redness, swelling, or pain. °? More  fluid or blood. °? Warmth. °? Pus or a bad smell. °· Do not use tampons or douches until your health care provider says this is safe. °· Watch for any blood clots that may pass from your vagina. These may look like clumps of dark red, brown, or black discharge. °General instructions °· Keep your perineum clean and dry as told by your health care provider. °· Wear loose, comfortable clothing. °· Wipe from front to back when you use the toilet. °· Ask your health care provider if you can shower or take a bath. If you had an episiotomy or a perineal tear during labor and delivery, your health care provider may tell you not to take baths for a certain length of time. °· Wear a bra that supports your breasts and fits you well. °· If possible, have someone help you with household activities and help care for your baby for at least a few days after you leave the hospital. °· Keep all follow-up visits for you and your baby as told by your health care provider. This is important. °Contact a health care provider if: °· You have: °?   Vaginal discharge that has a bad smell. °? Difficulty urinating. °? Pain when urinating. °? A sudden increase or decrease in the frequency of your bowel movements. °? More redness, swelling, or pain around your episiotomy or vaginal tear. °? More fluid or blood coming from your episiotomy or vaginal tear. °? Pus or a bad smell coming from your episiotomy or vaginal tear. °? A fever. °? A rash. °? Little or no interest in activities you used to enjoy. °? Questions about caring for yourself or your baby. °· Your episiotomy or vaginal tear feels warm to the touch. °· Your episiotomy or vaginal tear is separating or does not appear to be healing. °· Your breasts are painful, hard, or turn red. °· You feel unusually sad or worried. °· You feel nauseous or you vomit. °· You pass large blood clots from your vagina. If you pass a blood clot from your vagina, save it to show to your health care provider. Do  not flush blood clots down the toilet without having your health care provider look at them. °· You urinate more than usual. °· You are dizzy or light-headed. °· You have not breastfed at all and you have not had a menstrual period for 12 weeks after delivery. °· You have stopped breastfeeding and you have not had a menstrual period for 12 weeks after you stopped breastfeeding. °Get help right away if: °· You have: °? Pain that does not go away or does not get better with medicine. °? Chest pain. °? Difficulty breathing. °? Blurred vision or spots in your vision. °? Thoughts about hurting yourself or your baby. °· You develop pain in your abdomen or in one of your legs. °· You develop a severe headache. °· You faint. °· You bleed from your vagina so much that you fill two sanitary pads in one hour. °This information is not intended to replace advice given to you by your health care provider. Make sure you discuss any questions you have with your health care provider. °Document Released: 12/12/2000 Document Revised: 05/28/2016 Document Reviewed: 12/30/2015 °Elsevier Interactive Patient Education © 2019 Else[employer] Inc. ° °

## 2019-05-26 ENCOUNTER — Encounter (HOSPITAL_COMMUNITY): Payer: Self-pay | Admitting: Family Medicine

## 2019-05-27 ENCOUNTER — Telehealth: Payer: Self-pay

## 2019-05-27 NOTE — Telephone Encounter (Signed)
Patient called stating she is having some severe pain since her surgery and wanted to know if something else could be call in to help with pain she is having.She has used motrin and tylenol with no relief.   I have advised patient that we do not have a doctor here today. She should go back to [place] [facility] unit if her pain is severe. Patient  Voice understanding at this time.

## 2019-06-01 ENCOUNTER — Telehealth: Payer: Self-pay | Admitting: *Deleted

## 2019-06-01 ENCOUNTER — Encounter (HOSPITAL_COMMUNITY): Payer: Self-pay

## 2019-06-01 ENCOUNTER — Other Ambulatory Visit: Payer: Self-pay

## 2019-06-01 ENCOUNTER — Inpatient Hospital Stay (HOSPITAL_COMMUNITY)
Admission: AD | Admit: 2019-06-01 | Discharge: 2019-06-01 | Disposition: A | Discharge status: Home / Self Care | Payer: Medicaid Other | Attending: Obstetrics and Gynecology | Admitting: Obstetrics and Gynecology

## 2019-06-01 DIAGNOSIS — O165 Unspecified maternal hypertension, complicating the puerperium: Secondary | ICD-10-CM | POA: Diagnosis not present

## 2019-06-01 DIAGNOSIS — R51 Headache: Secondary | ICD-10-CM | POA: Diagnosis present

## 2019-06-01 DIAGNOSIS — O99335 Smoking (tobacco) complicating the puerperium: Secondary | ICD-10-CM | POA: Diagnosis not present

## 2019-06-01 DIAGNOSIS — F17210 Nicotine dependence, cigarettes, uncomplicated: Secondary | ICD-10-CM | POA: Diagnosis not present

## 2019-06-01 LAB — CBC WITH DIFFERENTIAL/PLATELET
Abs Immature Granulocytes: 0.03 10*3/uL (ref 0.00–0.07)
Basophils Absolute: 0 10*3/uL (ref 0.0–0.1)
Basophils Relative: 1 %
Eosinophils Absolute: 0.2 10*3/uL (ref 0.0–0.5)
Eosinophils Relative: 3 %
HCT: 36.7 % (ref 36.0–46.0)
Hemoglobin: 11.9 g/dL — ABNORMAL LOW (ref 12.0–15.0)
Immature Granulocytes: 1 %
Lymphocytes Relative: 28 %
Lymphs Abs: 1.8 10*3/uL (ref 0.7–4.0)
MCH: 28.6 pg (ref 26.0–34.0)
MCHC: 32.4 g/dL (ref 30.0–36.0)
MCV: 88.2 fL (ref 80.0–100.0)
Monocytes Absolute: 0.5 10*3/uL (ref 0.1–1.0)
Monocytes Relative: 8 %
Neutro Abs: 3.8 10*3/uL (ref 1.7–7.7)
Neutrophils Relative %: 59 %
Platelets: 257 10*3/uL (ref 150–400)
RBC: 4.16 MIL/uL (ref 3.87–5.11)
RDW: 13.8 % (ref 11.5–15.5)
WBC: 6.3 10*3/uL (ref 4.0–10.5)
nRBC: 0 % (ref 0.0–0.2)

## 2019-06-01 LAB — URINALYSIS, ROUTINE W REFLEX MICROSCOPIC
Bacteria, UA: NONE SEEN
Bilirubin Urine: NEGATIVE
Glucose, UA: NEGATIVE mg/dL
Ketones, ur: NEGATIVE mg/dL
Leukocytes,Ua: NEGATIVE
Nitrite: NEGATIVE
Protein, ur: NEGATIVE mg/dL
Specific Gravity, Urine: 1.012 (ref 1.005–1.030)
pH: 6 (ref 5.0–8.0)

## 2019-06-01 LAB — COMPREHENSIVE METABOLIC PANEL
ALT: 17 U/L (ref 0–44)
AST: 22 U/L (ref 15–41)
Albumin: 3.2 g/dL — ABNORMAL LOW (ref 3.5–5.0)
Alkaline Phosphatase: 97 U/L (ref 38–126)
Anion gap: 8 (ref 5–15)
BUN: 14 mg/dL (ref 6–20)
CO2: 24 mmol/L (ref 22–32)
Calcium: 8.6 mg/dL — ABNORMAL LOW (ref 8.9–10.3)
Chloride: 108 mmol/L (ref 98–111)
Creatinine, Ser: 0.71 mg/dL (ref 0.44–1.00)
GFR calc Af Amer: >60 mL/min (ref >60)
GFR calc non Af Amer: >60 mL/min (ref >60)
Glucose, Bld: 88 mg/dL (ref 70–99)
Potassium: 4.2 mmol/L (ref 3.5–5.1)
Sodium: 140 mmol/L (ref 135–145)
Total Bilirubin: 0.5 mg/dL (ref 0.3–1.2)
Total Protein: 6.4 g/dL — ABNORMAL LOW (ref 6.5–8.1)

## 2019-06-01 MED ORDER — IBUPROFEN 800 MG PO TABS: 800.0000 mg | ORAL_TABLET | Freq: Three times a day (TID) | ORAL | 0 refills | Status: DC | PRN

## 2019-06-01 MED ORDER — NIFEDIPINE 10 MG PO CAPS
10.0000 mg | ORAL_CAPSULE | Freq: Once | ORAL | Status: AC
  Administered 2019-06-01: 14:00:00 10 mg via ORAL
  Filled 2019-06-01: qty 1

## 2019-06-01 MED ORDER — BUTALBITAL-APAP-CAFFEINE 50-325-40 MG PO TABS: 1.0000 | ORAL_TABLET | Freq: Four times a day (QID) | ORAL | 0 refills | Status: DC | PRN

## 2019-06-01 MED ORDER — NIFEDIPINE ER OSMOTIC RELEASE 30 MG PO TB24: 30.0000 mg | ORAL_TABLET | Freq: Every day | ORAL | 0 refills | Status: DC

## 2019-06-01 MED ORDER — BUTALBITAL-APAP-CAFFEINE 50-325-40 MG PO TABS
2.0000 | ORAL_TABLET | Freq: Once | ORAL | Status: AC
  Administered 2019-06-01: 2 via ORAL
  Filled 2019-06-01: qty 2

## 2019-06-01 NOTE — Discharge Instructions (Signed)
Postpartum Hypertension  Postpartum hypertension is high blood pressure that remains higher than normal after childbirth. You may not realize that you have postpartum hypertension if your blood pressure is not being checked regularly. In most cases, postpartum hypertension will go away on its own, usually within a week of delivery. However, for some women, medical treatment is required to prevent serious complications, such as seizures or stroke.  What are the causes?  This condition may be caused by one or more of the following:   Hypertension that existed before pregnancy (chronic hypertension).   Hypertension that comes on as a result of pregnancy (gestational hypertension).   Hypertensive disorders during pregnancy (preeclampsia) or seizures in women who have high blood pressure during pregnancy (eclampsia).   A condition in which the liver, platelets, and red blood cells are damaged during pregnancy (HELLP syndrome).   A condition in which the thyroid produces too much hormones (hyperthyroidism).   Other rare problems of the nerves (neurological disorders) or blood disorders.  In some cases, the cause may not be known.  What increases the risk?  The following factors may make you more likely to develop this condition:   Chronic hypertension. In some cases, this may not have been diagnosed before pregnancy.   Obesity.   Type 2 diabetes.   Kidney disease.   History of preeclampsia or eclampsia.   Other medical conditions that change the level of hormones in the body (hormonal imbalance).  What are the signs or symptoms?  As with all types of hypertension, postpartum hypertension may not have any symptoms. Depending on how high your blood pressure is, you may experience:   Headaches. These may be mild, moderate, or severe. They may also be steady, constant, or sudden in onset (thunderclap headache).   Changes in your ability to see (visual changes).   Dizziness.   Shortness of breath.   Swelling  of your hands, feet, lower legs, or face. In some cases, you may have swelling in more than one of these locations.   Heart palpitations or a racing heartbeat.   Difficulty breathing while lying down.   Decrease in the amount of urine that you pass.  Other rare signs and symptoms may include:   Sweating more than usual. This lasts longer than a few days after delivery.   Chest pain.   Sudden dizziness when you get up from sitting or lying down.   Seizures.   Nausea or vomiting.   Abdominal pain.  How is this diagnosed?  This condition may be diagnosed based on the results of a physical exam, blood pressure measurements, and blood and urine tests.  You may also have other tests, such as a CT scan or an MRI, to check for other problems of postpartum hypertension.  How is this treated?  If blood pressure is high enough to require treatment, your options may include:   Medicines to reduce blood pressure (antihypertensives). Tell your health care provider if you are breastfeeding or if you plan to breastfeed. There are many antihypertensive medicines that are safe to take while breastfeeding.   Stopping medicines that may be causing hypertension.   Treating medical conditions that are causing hypertension.   Treating the complications of hypertension, such as seizures, stroke, or kidney problems.  Your health care provider will also continue to monitor your blood pressure closely until it is within a safe range for you.  Follow these instructions at home:   Take over-the-counter and prescription medicines only as   told by your health care provider.   Return to your normal activities as told by your health care provider. Ask your health care provider what activities are safe for you.   Do not use any products that contain nicotine or tobacco, such as cigarettes and e-cigarettes. If you need help quitting, ask your health care provider.   Keep all follow-up visits as told by your health care provider. This  is important.  Contact a health care provider if:   Your symptoms get worse.   You have new symptoms, such as:  ? A headache that does not get better.  ? Dizziness.  ? Visual changes.  Get help right away if:   You suddenly develop swelling in your hands, ankles, or face.   You have sudden, rapid weight gain.   You develop difficulty breathing, chest pain, racing heartbeat, or heart palpitations.   You develop severe pain in your abdomen.   You have any symptoms of a stroke. "BE FAST" is an easy way to remember the main warning signs of a stroke:  ? B - Balance. Signs are dizziness, sudden trouble walking, or loss of balance.  ? E - Eyes. Signs are trouble seeing or a sudden change in vision.  ? F - Face. Signs are sudden weakness or numbness of the face, or the face or eyelid drooping on one side.  ? A - Arms. Signs are weakness or numbness in an arm. This happens suddenly and usually on one side of the body.  ? S - Speech. Signs are sudden trouble speaking, slurred speech, or trouble understanding what people say.  ? T - Time. Time to call emergency services. Write down what time symptoms started.   You have other signs of a stroke, such as:  ? A sudden, severe headache with no known cause.  ? Nausea or vomiting.  ? Seizure.  These symptoms may represent a serious problem that is an emergency. Do not wait to see if the symptoms will go away. Get medical help right away. Call your local emergency services (911 in the [place] Do not drive yourself to the hospital.  Summary   Postpartum hypertension is high blood pressure that remains higher than normal after childbirth.   In most cases, postpartum hypertension will go away on its own, usually within a week of delivery.   For some women, medical treatment is required to prevent serious complications, such as seizures or stroke.  This information is not intended to replace advice given to you by your health care provider. Make sure you discuss any questions  you have with your health care provider.  Document Released: 08/18/2014 Document Revised: 10/05/2017 Document Reviewed: 10/05/2017  Elsevier Interactive Patient Education  2019 [employer]

## 2019-06-01 NOTE — MAU Provider Note (Signed)
History     CSN: [id number]  Arrival date and time: 06/01/19 1202   First Provider Initiated Contact with Patient 06/01/19 1314      Chief Complaint  Patient presents with  . Headache   Peggy [name] is a 33 y.o. [id number] who is 9 day PP from [facility]VD who is here today with a headache. She did not have any issue with blood pressure during the pregnancy. She denies any history of blood pressure problems with any of her prior pregnancies. She states that she took her blood pressure at home today and it was 17/80.   Headache   This is a new problem. The current episode started yesterday. The problem occurs constantly. The problem has been unchanged. The pain is located in the temporal and bilateral region. The pain quality is similar to prior headaches. The pain is at a severity of 6/10. Pertinent negatives include no fever, nausea or vomiting. The symptoms are aggravated by bright light. She has tried acetaminophen, darkened room and NSAIDs for the symptoms. The treatment provided mild (goes away briefly and then returns ) relief.    OB History    Gravida  4   Para  4   Term  1   Preterm  3   AB      Living  4     SAB      TAB      Ectopic      Multiple  0   Live Births  4           Past Medical History:  Diagnosis Date  . Allergies   . Anemia   . 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 GASTROSCHISIS CLOSURE    . TUBAL LIGATION Bilateral 05/24/2019   Procedure: POST PARTUM TUBAL LIGATION;  Surgeon: Levie Heritage, DO;  Location: [facility];  Service: Gynecology;  Laterality: Bilateral;    Family History  Problem Relation Age of Onset  . Hypertension Mother   . Arthritis Father   . Cancer Sister     Social History   Tobacco Use  . Smoking status: Current Some Day Smoker    Packs/day: 0.25    Last attempt to quit: 08/31/2018    Years since quitting: 0.7  . Smokeless tobacco: Never Used  Substance Use  Topics  . Alcohol use: Not Currently  . Drug use: Not Currently    Allergies: No Known Allergies  Medications Prior to Admission  Medication Sig Dispense Refill Last Dose  . acetaminophen (TYLENOL) 325 MG tablet Take 650 mg by mouth every 6 (six) hours as needed for moderate pain or headache.   Taking  . albuterol (PROVENTIL HFA;VENTOLIN HFA) 108 (90 Base) MCG/ACT inhaler Inhale 1-2 puffs into the lungs every 6 (six) hours as needed for wheezing or shortness of breath. 1 Inhaler 2 Taking  . cetirizine (ZYRTEC) 10 MG tablet Take 1 tablet (10 mg total) by mouth daily. 30 tablet 5 Taking  . docusate sodium (COLACE) 250 MG capsule Take 1 capsule (250 mg total) by mouth daily. 10 capsule 0   . ibuprofen (ADVIL) 800 MG tablet Take 1 tablet (800 mg total) by mouth 3 (three) times daily. 30 tablet 0   . oxyCODONE (OXY IR/ROXICODONE) 5 MG immediate release tablet Take 1 tablet (5 mg total) by mouth every 4 (four) hours as needed for severe pain. 30 tablet 0   . Prenatal Multivit-Min-Fe-FA (PRENATAL VITAMINS) 0.8  MG tablet Take 1 tablet by mouth daily. 30 tablet 12 Taking    Review of Systems  Constitutional: Negative for chills and fever.  Respiratory: Positive for chest tightness. Negative for shortness of breath.   Gastrointestinal: Negative for nausea and vomiting.  Neurological: Positive for headaches.   Physical Exam   Blood pressure (!) 151/80, pulse (!) 44, temperature 98 F (36.7 C), resp. rate 16, weight 58.1 kg, SpO2 100 %, unknown if currently breastfeeding.  Physical Exam  Nursing note and vitals reviewed. Constitutional: She is oriented to person, place, and time. She appears well-developed and well-nourished. No distress.  HENT:  Head: Normocephalic.  Cardiovascular: Normal rate.  Respiratory: Effort normal.  GI: Soft. There is no abdominal tenderness. There is no rebound.  Musculoskeletal: Normal range of motion.        General: No edema.  Neurological: She is alert and  oriented to person, place, and time.  Skin: Skin is warm and dry.  Psychiatric: She has a normal mood and affect.   Results for orders placed or performed during the hospital encounter of 06/01/19 (from the past 24 hour(s))  Urinalysis, Routine w reflex microscopic     Status: Abnormal   Collection Time: 06/01/19 12:47 PM  Result Value Ref Range   Color, Urine YELLOW YELLOW   APPearance CLEAR CLEAR   Specific Gravity, Urine 1.012 1.005 - 1.030   pH 6.0 5.0 - 8.0   Glucose, UA NEGATIVE NEGATIVE mg/dL   Hgb urine dipstick LARGE (A) NEGATIVE   Bilirubin Urine NEGATIVE NEGATIVE   Ketones, ur NEGATIVE NEGATIVE mg/dL   Protein, ur NEGATIVE NEGATIVE mg/dL   Nitrite NEGATIVE NEGATIVE   Leukocytes,Ua NEGATIVE NEGATIVE   RBC / HPF 6-10 0 - 5 RBC/hpf   WBC, UA 0-5 0 - 5 WBC/hpf   Bacteria, UA NONE SEEN NONE SEEN   Squamous Epithelial / LPF 0-5 0 - 5  CBC with Differential/Platelet     Status: Abnormal   Collection Time: 06/01/19  2:11 PM  Result Value Ref Range   WBC 6.3 4.0 - 10.5 K/uL   RBC 4.16 3.87 - 5.11 MIL/uL   Hemoglobin 11.9 (L) 12.0 - 15.0 g/dL   HCT 78.2 95.6 - 21.3 %   MCV 88.2 80.0 - 100.0 fL   MCH 28.6 26.0 - 34.0 pg   MCHC 32.4 30.0 - 36.0 g/dL   RDW 08.6 57.8 - 46.9 %   Platelets 257 150 - 400 K/uL   nRBC 0.0 0.0 - 0.2 %   Neutrophils Relative % 59 %   Neutro Abs 3.8 1.7 - 7.7 K/uL   Lymphocytes Relative 28 %   Lymphs Abs 1.8 0.7 - 4.0 K/uL   Monocytes Relative 8 %   Monocytes Absolute 0.5 0.1 - 1.0 K/uL   Eosinophils Relative 3 %   Eosinophils Absolute 0.2 0.0 - 0.5 K/uL   Basophils Relative 1 %   Basophils Absolute 0.0 0.0 - 0.1 K/uL   Immature Granulocytes 1 %   Abs Immature Granulocytes 0.03 0.00 - 0.07 K/uL  Comprehensive metabolic panel     Status: Abnormal   Collection Time: 06/01/19  2:11 PM  Result Value Ref Range   Sodium 140 135 - 145 mmol/L   Potassium 4.2 3.5 - 5.1 mmol/L   Chloride 108 98 - 111 mmol/L   CO2 24 22 - 32 mmol/L   Glucose, Bld 88  70 - 99 mg/dL   BUN 14 6 - 20 mg/dL   Creatinine, Ser  0.71 0.44 - 1.00 mg/dL   Calcium 8.6 (L) 8.9 - 10.3 mg/dL   Total Protein 6.4 (L) 6.5 - 8.1 g/dL   Albumin 3.2 (L) 3.5 - 5.0 g/dL   AST 22 15 - 41 U/L   ALT 17 0 - 44 U/L   Alkaline Phosphatase 97 38 - 126 U/L   Total Bilirubin 0.5 0.3 - 1.2 mg/dL   GFR calc non Af Amer >60 >60 mL/min   GFR calc Af Amer >60 >60 mL/min   Anion gap 8 5 - 15   Patient Vitals for the past 24 hrs:  BP Temp Pulse Resp SpO2 Weight  06/01/19 1445 103/60 - 63 - - -  06/01/19 1431 116/65 - 61 - - -  06/01/19 1415 118/83 - (!) 47 - - -  06/01/19 1401 93/71 - (!) 42 - - -  06/01/19 1346 (!) 150/67 - (!) 45 - - -  06/01/19 1331 (!) 159/85 - (!) 44 - - -  06/01/19 1316 (!) 157/76 - (!) 43 - - -  06/01/19 1301 (!) 151/80 - (!) 44 - - -  06/01/19 1255 (!) 154/74 - (!) 46 - - -  06/01/19 1234 (!) 153/81 - - - - -  06/01/19 1230 (!) 157/84 98 F (36.7 C) (!) 51 16 100 % 58.1 kg    MAU Course  Procedures  MDM  Patient has had 10mg  procardia and Fioricet for headache. Her blood pressure has improved, and her headache has also improved.   4:31 PM: consult with Dr. Jolayne Pantheronstant. Ok for DC home, will have her start 30 mg XL procardia since she responded so well to it while here. Patient to have BP check in the office next week.   Assessment and Plan   1. Postpartum hypertension    DC home Comfort measures reviewed  Pre-eclampsia warning signs reviewed  RX: procardia 30mg  XL qd  Return to MAU as needed FU with OB as planned  Follow-up Information    [facility].   Specialty:  Obstetrics and Gynecology Why:  Blood pressure check next week. They will call you.  Contact information: [address] [place] [phone]         Thressa ShellerHeather  DNP, CNM  06/01/19  4:40 PM

## 2019-06-01 NOTE — MAU Note (Signed)
.   Peggy [name] is a 33 y.o. at Unknown here in MAU reporting: headache and lower abdominal cramping. Pt had a vaginal delivery on 05/23/19 with BTL on 05/24/19.   Onset of complaint: 3-4 days ago Pain score: 10 Vitals:   06/01/19 1230  BP: (!) 157/84  Pulse: (!) 51  Resp: 16  Temp: 98 F (36.7 C)  SpO2: 100%      Lab orders placed from triage: UA

## 2019-06-01 NOTE — Telephone Encounter (Signed)
Pt called stating she is having a lot of abdominal pain and the pain meds aren't helping and her blood pressure have been elevated. Today was 171/ 88/. Instructed to to go [facility] maternity assessment unit to be evaluated.

## 2019-06-07 ENCOUNTER — Other Ambulatory Visit: Payer: Self-pay

## 2019-06-07 ENCOUNTER — Ambulatory Visit (INDEPENDENT_AMBULATORY_CARE_PROVIDER_SITE_OTHER): Payer: Medicaid Other | Admitting: *Deleted

## 2019-06-07 VITALS — BP 130/80

## 2019-06-07 DIAGNOSIS — O165 Unspecified maternal hypertension, complicating the puerperium: Secondary | ICD-10-CM

## 2019-06-07 NOTE — Progress Notes (Signed)
Subjective:  Peggy [name] is a 33 y.o. female here for BP check.   Hypertension ROS: taking medications as instructed, no medication side effects noted, no TIA's, no chest pain on exertion, no dyspnea on exertion and no swelling of ankles.   Objective:  There were no vitals taken for this visit.  Appearance alert, well appearing, and in no distress. General exam BP noted to be well controlled today in office.    Assessment:   Blood Pressure stable.   Plan:  Current treatment plan is effective, no change in therapy.

## 2019-06-13 NOTE — Progress Notes (Signed)
I have reviewed this chart and agree with the RN/CMA assessment and management.    Elson Ulbrich C Neill Jurewicz, MD, FACOG Attending Physician, [facility]  

## 2019-06-16 ENCOUNTER — Telehealth: Payer: Self-pay | Admitting: *Deleted

## 2019-06-16 DIAGNOSIS — F53 Postpartum depression: Secondary | ICD-10-CM

## 2019-06-16 NOTE — Telephone Encounter (Signed)
Received a phone call from home health RN in regards to her virtual visit today with pt. She is still complaining of headache and now spots in her vision. She also scored a 16 on her depression scale. Will reach out to pt.

## 2019-06-16 NOTE — Telephone Encounter (Signed)
Called pt in regards to her visit today with home health RN. Pt states she continues to get headaches everyday and that the Fioricet is not helping and the vision changes started 2 days ago. Pt stated her BP's has been in the 150-160's. Pt is still taking her procardia daily and states that the motrin seems to help the headache the best.  Pt is willing to talk to counselor in regards to her depression screen. Will call provider to discuss and call pt back with POC. Pt verbalizes and understands.

## 2019-06-16 NOTE — Telephone Encounter (Signed)
Called pt to inform that is recommended that she go to [facility] for further evaluation and told pt that Roselyn Reef will be reaching out to her as well.

## 2019-06-21 ENCOUNTER — Encounter: Payer: Self-pay | Admitting: Obstetrics & Gynecology

## 2019-06-21 ENCOUNTER — Telehealth (INDEPENDENT_AMBULATORY_CARE_PROVIDER_SITE_OTHER): Payer: Medicaid Other | Admitting: Obstetrics & Gynecology

## 2019-06-21 ENCOUNTER — Other Ambulatory Visit: Payer: Self-pay

## 2019-06-21 ENCOUNTER — Ambulatory Visit: Payer: Medicaid Other | Admitting: Obstetrics & Gynecology

## 2019-06-21 VITALS — BP 138/84

## 2019-06-21 DIAGNOSIS — O165 Unspecified maternal hypertension, complicating the puerperium: Secondary | ICD-10-CM

## 2019-06-21 DIAGNOSIS — F53 Postpartum depression: Secondary | ICD-10-CM

## 2019-06-21 DIAGNOSIS — O9089 Other complications of the puerperium, not elsewhere classified: Secondary | ICD-10-CM

## 2019-06-21 MED ORDER — ENALAPRIL MALEATE 10 MG PO TABS: 10.0000 mg | ORAL_TABLET | Freq: Every day | ORAL | 2 refills | Status: DC

## 2019-06-21 MED ORDER — IBUPROFEN 800 MG PO TABS: 800.0000 mg | ORAL_TABLET | Freq: Three times a day (TID) | ORAL | 3 refills | Status: DC | PRN

## 2019-06-21 NOTE — Patient Instructions (Signed)
Return to clinic for any scheduled appointments or for any gynecologic concerns as needed.   

## 2019-06-21 NOTE — Progress Notes (Signed)
TELEHEALTH VIRTUAL POSTPARTUM VISIT ENCOUNTER NOTE  I connected with  Peggy Peggy [name] on 06/21/19 at 10:00 AM EDT by My Chart Video Encounter and verified that I am speaking with the correct person using two identifiers.   I discussed the limitations, risks, security and privacy concerns of performing an evaluation and management service by telephone and the availability of in person appointments. I also discussed with the patient that there may be a patient responsible charge related to this service. The patient expressed understanding and agreed to proceed.  Peggy Peggy [name], [place] 06/21/2019  10:36 AM   Post Partum Exam Appointment Date: 06/21/2019 OBGYN Clinic: [facility] [place] [facility] [place] is a 33 y.o. [phone] female who presents for a postpartum visit. She is four weeks postpartum following a delivery:. I have fully reviewed the prenatal and intrapartum course. The delivery was at 75 gestational weeks.  Anesthesia: Epidual. Postpartum course has been complicated by development of postpartum hypertension and headache, treated with Procardia and Fioricet. She reports headaches are controlled on Ibuprofen, but has BP in 130-150s/80-90s.  Baby's course has been uncomplicated. Baby is feeding by breast/bottle . Bleeding yes. Bowel function is normal. Bladder function is normal. Patient is not sexually active. Contraception method is Tubal ligation. Positive PP depression screen, see below. Already has appointment with [facility] Counselor tomorrow, denies HI/SI.   The following portions of the patient's history were reviewed and updated as appropriate: allergies, current medications, past family history, past medical history, past social history, past surgical history and problem list.   Health Maintenance:  Normal pap in 2018 as per patient.   Review of Systems:Her 12 point review of systems is negative or as noted in the History of Present Illness.  Patient Active  Problem List   Diagnosis Date Noted  . Indication for care in labor and delivery, antepartum 05/23/2019  . Small fetal head circumference on ultrasound @ 36wks 05/09/2019  . Fundal height low for dates, third trimester 05/05/2019  . Gestational thrombocytopenia ([place]) 04/14/2019  . Asthma affecting pregnancy in third trimester 01/13/2019  . Supervision of high risk pregnancy, antepartum 01/06/2019  . History of preterm delivery, currently pregnant 01/06/2019  . History of gastroschisis 01/06/2019    Medications Peggy Peggy [name] had no medications administered during this visit. Current Outpatient Medications  Medication Sig Dispense Refill  . albuterol (PROVENTIL HFA;VENTOLIN HFA) 108 (90 Base) MCG/ACT inhaler Inhale 1-2 puffs into the lungs every 6 (six) hours as needed for wheezing or shortness of breath. 1 Inhaler 2  . cetirizine (ZYRTEC) 10 MG tablet Take 1 tablet (10 mg total) by mouth daily. 30 tablet 5  . ibuprofen (ADVIL) 800 MG tablet Take 1 tablet (800 mg total) by mouth every 8 (eight) hours as needed for headache or cramping. 30 tablet 3  . Prenatal Multivit-Min-Fe-FA (PRENATAL VITAMINS) 0.8 MG tablet Take 1 tablet by mouth daily. 30 tablet 12  . enalapril (VASOTEC) 10 MG tablet Take 1 tablet (10 mg total) by mouth daily. 30 tablet 2   No current facility-administered medications for this visit.     Allergies Patient has no known allergies.  Physical Exam:  General:  Alert, oriented and cooperative.   Mental Status: Normal mood and affect perceived. Normal judgment and thought content.  Rest of physical exam deferred due to type of encounter  PP Depression Screening:   Edinburgh Postnatal Depression Scale - 06/21/19 0958      Edinburgh Postnatal Depression Scale:  In the  Past 7 Days   I have been able to laugh and see the funny side of things.  1    I have looked forward with enjoyment to things.  1    I have blamed myself unnecessarily when things went wrong.  3    I  have been anxious or worried for no good reason.  2    I have felt scared or panicky for no good reason.  0    Things have been getting on top of me.  2    I have been so unhappy that I have had difficulty sleeping.  2    I have felt sad or miserable.  1    I have been so unhappy that I have been crying.  2    The thought of harming myself has occurred to me.  0    Edinburgh Postnatal Depression Scale Total  14       Assessment:Patient is a 33 y.o. [id number] who is 4 weeks postpartum from a normal spontaneous vaginal delivery and tubal ligation was performed.  She has postpartum hypertension and depression. Baby is doing well.  Plan: 1. Postpartum hypertension Will change BP med to Enalapril, this may help with headaches to. Reevaluate in one week. Preeclampsia precautions given.  - enalapril (VASOTEC) 10 MG tablet; Take 1 tablet (10 mg total) by mouth daily.  Dispense: 30 tablet; Refill: 2  2. Postpartum headache Ibuprofen refilled - ibuprofen (ADVIL) 800 MG tablet; Take 1 tablet (800 mg total) by mouth every 8 (eight) hours as needed for headache or cramping.  Dispense: 30 tablet; Refill: 3  3. Postpartum depression Patient [facility] presenting concerns; appointment is tomorrow.  4. Postpartum care following vaginal delivery No other concerns.  BTL incision is healing well as per patient.  I discussed the assessment and treatment plan with the patient. The patient was provided an opportunity to ask questions and Peggy [name] were answered. The patient agreed with the plan and demonstrated an understanding of the instructions.   The patient was advised to call back or seek an in-person evaluation/go to the ED for any concerning postpartum symptoms.  I provided 20 minutes of face-to-face time during this encounter.   Jaynie CollinsUgonna Iara Monds, MD [facility], [facility]

## 2019-06-22 ENCOUNTER — Ambulatory Visit (INDEPENDENT_AMBULATORY_CARE_PROVIDER_SITE_OTHER): Payer: Self-pay | Admitting: Clinical

## 2019-06-22 DIAGNOSIS — F4322 Adjustment disorder with anxiety: Secondary | ICD-10-CM

## 2019-06-22 NOTE — BH Specialist Note (Signed)
Integrated Behavioral Health Visit via Telemedicine (Telephone)  06/22/2019 Peggy [name] [id number]  Session Start time: 1:02  Session End time: 1:53 Total time: 50 minutes  Referring Provider: Verita Schneiders, MD for positive depression screen Type of Visit: Webex video Patient location: Home [facility] Provider location: [facility] All persons participating in visit: Patient Peggy [name] and [place]  Confirmed patient's address: Yes  Confirmed patient's phone number: Yes  Any changes to demographics: No   Confirmed patient's insurance: Yes  Any changes to patient's insurance: No   Discussed confidentiality: Yes    The following statements were read to the patient and/or legal guardian that are established with the [facility] Provider.  "The purpose of this phone visit is to provide behavioral health care while limiting exposure to the coronavirus (COVID19).  There is a possibility of technology failure and discussed alternative modes of communication if that failure occurs."  "By engaging in this telephone visit, you consent to the provision of healthcare.  Additionally, you authorize for your insurance to be billed for the services provided during this telephone visit."   Patient and/or legal guardian consented to virtual visit: Yes   PRESENTING CONCERNS: Patient and/or family reports the following symptoms/concerns: Pt states her primary concern today is fatigue, anxiety, excessive worry, stress, irritability, dread, restlessness, panic; pt attributes increase to life stress postpartum.  Duration of problem: Increase postpartum; has experienced anxiety since prior to pregnancy; Severity of problem: severe  STRENGTHS (Protective Factors/Coping Skills): Supportive family; self-awareness  GOALS ADDRESSED: Patient will: 1.  Reduce symptoms of: anxiety and stress  2.  Increase knowledge and/or ability of: healthy habits  3.  Demonstrate ability to: Increase healthy  adjustment to current life circumstances and Increase adequate support systems for patient/family  INTERVENTIONS: Interventions utilized:  Mindfulness or Psychologist, educational, Psychoeducation and/or Health Education and Link to [employer] Standardized Assessments completed: GAD-7 and PHQ 9  ASSESSMENT: Patient currently experiencing Adjustment disorder with anxious mood.   Patient may benefit from psychoeducation and brief therapeutic interventions regarding coping with symptoms of anxiety .  PLAN: 1. Follow up with behavioral health clinician on : One week 2. Behavioral recommendations:  -CALM relaxation breathing exercise at least twice daily (morning; at bedtime) -Begin using sleep sounds at night for self and baby, as discussed -Consider joining online new mom support group at either conehealthybaby.com or BlogApproved.tn.  Referral(s): Integrated Orthoptist (In Clinic) and [employer] Resources:  New mom support  Caroleen [facility]

## 2019-06-23 MED ORDER — HYDROXYZINE HCL 25 MG PO TABS: 25.0000 mg | ORAL_TABLET | Freq: Four times a day (QID) | ORAL | 2 refills | Status: DC | PRN

## 2019-06-23 MED ORDER — BUSPIRONE HCL 10 MG PO TABS: 10.0000 mg | ORAL_TABLET | Freq: Three times a day (TID) | ORAL | 3 refills | Status: DC

## 2019-06-23 NOTE — Progress Notes (Addendum)
As per [facility] Counselor recommendations, patient will be prescribed some anti anxiety medications. Will prescribe Vistaril, also add Buspar (they are safe with lactation).  [facility] Counselor to reevaluate, and help with referral to mental health provider as needed.   Verita Schneiders, MD, [place] for [employer], [place]

## 2019-06-23 NOTE — Addendum Note (Signed)
Addended by: Verita Schneiders A on: 06/23/2019 01:23 PM   Modules accepted: Orders

## 2019-06-27 ENCOUNTER — Other Ambulatory Visit: Payer: Self-pay

## 2019-06-30 ENCOUNTER — Ambulatory Visit: Payer: Medicaid Other | Admitting: Clinical

## 2019-06-30 NOTE — BH Specialist Note (Signed)
Pt did not arrive to [facility] video visit and did not answer the phone; Left HIPPA-compliant message to call back Roselyn Reef from [facility] for [employer] at [phone], and left MyChart message.    [place] via Telemedicine Video Visit  06/30/2019 Peggy [name] [id number]   Peggy [name]

## 2019-07-12 ENCOUNTER — Emergency Department (HOSPITAL_COMMUNITY): Payer: Medicaid Other

## 2019-07-12 ENCOUNTER — Encounter (HOSPITAL_COMMUNITY): Payer: Self-pay | Admitting: Emergency Medicine

## 2019-07-12 ENCOUNTER — Emergency Department (HOSPITAL_COMMUNITY)
Admission: EM | Admit: 2019-07-12 | Discharge: 2019-07-12 | Disposition: A | Discharge status: Home / Self Care | Payer: Medicaid Other | Attending: Emergency Medicine | Admitting: Emergency Medicine

## 2019-07-12 ENCOUNTER — Other Ambulatory Visit: Payer: Self-pay

## 2019-07-12 DIAGNOSIS — J45909 Unspecified asthma, uncomplicated: Secondary | ICD-10-CM | POA: Diagnosis not present

## 2019-07-12 DIAGNOSIS — R05 Cough: Secondary | ICD-10-CM | POA: Diagnosis not present

## 2019-07-12 DIAGNOSIS — J069 Acute upper respiratory infection, unspecified: Secondary | ICD-10-CM

## 2019-07-12 DIAGNOSIS — J029 Acute pharyngitis, unspecified: Secondary | ICD-10-CM

## 2019-07-12 DIAGNOSIS — Z20828 Contact with and (suspected) exposure to other viral communicable diseases: Secondary | ICD-10-CM | POA: Diagnosis not present

## 2019-07-12 DIAGNOSIS — R509 Fever, unspecified: Secondary | ICD-10-CM | POA: Diagnosis present

## 2019-07-12 DIAGNOSIS — R079 Chest pain, unspecified: Secondary | ICD-10-CM | POA: Diagnosis not present

## 2019-07-12 DIAGNOSIS — F17200 Nicotine dependence, unspecified, uncomplicated: Secondary | ICD-10-CM | POA: Insufficient documentation

## 2019-07-12 DIAGNOSIS — O9953 Diseases of the respiratory system complicating the puerperium: Secondary | ICD-10-CM | POA: Diagnosis not present

## 2019-07-12 DIAGNOSIS — Z79899 Other long term (current) drug therapy: Secondary | ICD-10-CM | POA: Insufficient documentation

## 2019-07-12 LAB — GROUP A STREP BY PCR: Group A Strep by PCR: NOT DETECTED

## 2019-07-12 NOTE — ED Provider Notes (Signed)
[facility]   CSN: [id number] Arrival date & time: 07/12/19  1804   History   Chief Complaint Chief Complaint  Patient presents with  . Cough  . Fever    HPI Peggy [name] is a 33 y.o. female with past medical history significant for asthma, anemia who presents for evaluation of fever, cough, sore throat x2 days.  Has been taking NyQuil at home with mild relief of symptoms.  She has been able to tolerate p.o. liquids however has had pain with solid food intake.  Describes her sore throat as scratchy.  Sore throat not unilateral nature.  Patient states she has a 286-week-old child at home who has had fever, cough and decreased p.o. intake as well.  [facility] is been wearing a mask.  No known COVID positive contacts.  Temperature max 102.0 at home yesterday.  Cough productive with light green sputum.  Denies chills, headache, neck stiffness, neck rigidity, drooling, dysphasia, trismus, chest pain, shortness of breath, abdominal pain, dysuria, diarrhea, constipation, pelvic pain, vaginal discharge.  Denies additional aggravating or alleviating factors.  Was seen yesterday by OB/GYN and was cleared at that time.  No OB/GYN or postpartum complications.  History obtained from patient and past medical records.  No interpreter was used.    HPI  Past Medical History:  Diagnosis Date  . Allergies   . Anemia   . Asthma   . 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 Small fetal head circumference on ultrasound @ 36wks 05/09/2019    Past Surgical History:  Procedure Laterality Date  . GASTROSCHISIS CLOSURE    . TUBAL LIGATION Bilateral 05/24/2019   Procedure: POST PARTUM TUBAL LIGATION;  Surgeon: Levie HeritageStinson, Jacob J, DO;  Location: [facility] LD ORS;  Service: Gynecology;  Laterality: Bilateral;     OB History    Gravida  4   Para  4   Term  1   Preterm  3   AB      Living  4     SAB      TAB      Ectopic      Multiple  0   Live Births  4            Home Medications    Prior to Admission medications   Medication Sig Start Date End Date Taking? Authorizing Provider  albuterol (PROVENTIL HFA;VENTOLIN HFA) 108 (90 Base) MCG/ACT inhaler Inhale 1-2 puffs into the lungs every 6 (six) hours as needed for wheezing or shortness of breath. 04/14/19   Parchment BingPickens, Charlie, MD  busPIRone (BUSPAR) 10 MG tablet Take 1 tablet (10 mg total) by mouth 3 (three) times daily. 06/23/19   Anyanwu, Jethro BastosUgonna A, MD  cetirizine (ZYRTEC) 10 MG tablet Take 1 tablet (10 mg total) by mouth daily. 12/31/18   Leftwich-Kirby, Wilmer FloorLisa A, CNM  enalapril (VASOTEC) 10 MG tablet Take 1 tablet (10 mg total) by mouth daily. 06/21/19   Anyanwu, Jethro BastosUgonna A, MD  hydrOXYzine (ATARAX/VISTARIL) 25 MG tablet Take 1-2 tablets (25-50 mg total) by mouth every 6 (six) hours as needed for anxiety. 06/23/19   Anyanwu, Jethro BastosUgonna A, MD  ibuprofen (ADVIL) 800 MG tablet Take 1 tablet (800 mg total) by mouth every 8 (eight) hours as needed for headache or cramping. 06/21/19   Anyanwu, Jethro BastosUgonna A, MD  Prenatal Multivit-Min-Fe-FA (PRENATAL VITAMINS) 0.8 MG tablet Take  1 tablet by mouth daily. 12/31/18   Leftwich-Kirby, Wilmer FloorLisa A, CNM    Family History Family History  Problem Relation Age of Onset  . Hypertension Mother   . Arthritis Father   . Cancer Sister     Social History Social History   Tobacco Use  . Smoking status: Current Some Day Smoker    Packs/day: 0.25    Last attempt to quit: 08/31/2018    Years since quitting: 0.8  . Smokeless tobacco: Never Used  Substance Use Topics  . Alcohol use: Not Currently  . Drug use: Not Currently     Allergies   Patient has no known allergies.   Review of Systems Review of Systems  Constitutional: Positive for chills and fever. Negative for activity change, appetite change, diaphoresis, fatigue and unexpected weight change.  HENT: Positive for  congestion, rhinorrhea and sore throat. Negative for dental problem, drooling, ear discharge, ear pain, facial swelling, nosebleeds, postnasal drip, sinus pressure, sinus pain, sneezing, trouble swallowing and voice change.   Eyes: Negative.   Respiratory: Positive for cough. Negative for apnea, choking, chest tightness, shortness of breath, wheezing and stridor.   Cardiovascular: Negative.   Gastrointestinal: Negative.   Genitourinary: Negative.   Musculoskeletal: Negative.   Skin: Negative.   Neurological: Negative.   All other systems reviewed and are negative.    Physical Exam Updated Vital Signs BP 138/88   Pulse 71   Temp 98.2 F (36.8 C) (Oral)   Resp 16   Ht 4\' 11"  (1.499 m)   Wt 50.8 kg   SpO2 99%   BMI 22.62 kg/m   Physical Exam Vitals signs and nursing note reviewed.  Constitutional:      General: She is not in acute distress.    Appearance: She is not ill-appearing, toxic-appearing or diaphoretic.  HENT:     Head: Normocephalic and atraumatic.     Jaw: There is normal jaw occlusion.     Right Ear: Tympanic membrane, ear canal and external ear normal. There is no impacted cerumen. No hemotympanum. Tympanic membrane is not injected, scarred, perforated, erythematous, retracted or bulging.     Left Ear: Tympanic membrane, ear canal and external ear normal. There is no impacted cerumen. No hemotympanum. Tympanic membrane is not injected, scarred, perforated, erythematous, retracted or bulging.     Ears:     Comments: No Mastoid tenderness.    Nose:     Comments: Clear rhinorrhea and congestion to bilateral nares.  No sinus tenderness.    Mouth/Throat:     Comments: Posterior oropharynx clear.  Mucous membranes moist.  Tonsils without erythema or exudate.  Uvula midline without deviation.  No evidence of PTA or RPA.  No drooling, dysphasia or trismus.  Phonation normal. Neck:     Trachea: Trachea and phonation normal.     Meningeal: Brudzinski's sign and Kernig's  sign absent.     Comments: No Neck stiffness or neck rigidity.  No meningismus.  No cervical lymphadenopathy. Cardiovascular:     Comments: No murmurs rubs or gallops. Pulmonary:     Comments: Clear to auscultation bilaterally without wheeze, rhonchi or rales.  No accessory muscle usage.  Able speak in full sentences. Abdominal:     Comments: Soft, nontender without rebound or guarding.  No CVA tenderness.  Musculoskeletal:     Comments: Moves all 4 extremities without difficulty.  Lower extremities without edema, erythema or warmth.  Skin:    Comments: Brisk capillary refill.  No rashes or lesions.  Neurological:     Mental Status: She is alert.     Comments: Ambulatory in department without difficulty.  Cranial nerves II through XII grossly intact.  No facial droop.  No aphasia.      ED Treatments / Results  Labs (all labs ordered are listed, but only abnormal results are displayed) Labs Reviewed  GROUP A STREP BY PCR  NOVEL CORONAVIRUS, NAA (HOSPITAL ORDER, SEND-OUT TO REF LAB)    EKG None  Radiology Dg Chest Portable 1 View  Result Date: 07/12/2019 CLINICAL DATA:  Productive cough, fever and myalgias, pharyngitis for the past 3 days. Central chest pain today. EXAM: PORTABLE CHEST 1 VIEW COMPARISON:  None. FINDINGS: The heart size and mediastinal contours are within normal limits. Both lungs are clear. The visualized skeletal structures are unremarkable. IMPRESSION: Normal examination. Electronically Signed   By: Claudie Revering M.D.   On: 07/12/2019 19:21    Procedures Procedures (including critical care time)  Medications Ordered in ED Medications - No data to display   Initial Impression / Assessment and Plan / ED Course  I have reviewed the triage vital signs and the nursing notes.  Pertinent labs & imaging results that were available during my care of the patient were reviewed by me and considered in my medical decision making (see chart for details).  33 year old  female appears otherwise well presents for evaluation of upper respiratory symptoms.  Patient has had a fever, 102 max at home as well as productive cough and sore throat.  She is tolerating p.o. intake without difficulty.  No drooling, dysphasia or trismus.  Posterior oropharynx clear without edema or exudate.  Tonsils without edema or exudate.  Sublingual area soft.  No facial swelling, submandibular swelling.  No evidence of PTA, RPA, Ludwig's angina.  Heart and lungs clear.  Stiffness or neck rigidity.  Abdomen soft, nontender without rebound or guarding.  6 weeks Postpartum.  Was seen by OB/GYN yesterday and cleared.  She has no pelvic pain, vaginal discharge to suggest fever from postpartum complication.  She is afebrile, nonseptic, non-ill-appearing.  She is without tachycardia, tachypnea or hypoxia.  Strep negative.  Chest x-ray negative for infiltrates, cardiomegaly, pulmonary edema, pneumothorax.  Outpatient COVID testing obtained.  Patient likely with viral upper respiratory infection.  Discussed symptomatic management at home.  She knows to home isolate until her COVID testing has resulted.  She appears overall well.  No evidence of sepsis or sirs.   The patient has been appropriately medically screened and/or stabilized in the ED. I have low suspicion for any other emergent medical condition which would require further screening, evaluation or treatment in the ED or require inpatient management.  Patient is hemodynamically stable and in no acute distress.  Patient able to ambulate in department prior to ED.  Evaluation does not show acute pathology that would require ongoing or additional emergent interventions while in the emergency department or further inpatient treatment.  I have discussed the diagnosis with the patient and answered all questions.  Patient has no further complaints prior to discharge.  Patient is comfortable with plan discussed in room and is stable for discharge at this time.   I have discussed strict return precautions for returning to the emergency department.  Patient was encouraged to follow-up with PCP/specialist refer to at discharge.     Final Clinical Impressions(s) / ED Diagnoses   Final diagnoses:  Viral URI with cough  Sore throat    ED Discharge Orders    None  Dicky Boer A, PA-C 07/12/19 2052    Gerhard MunchLockwood, Robert, MD 07/18/19 1919

## 2019-07-12 NOTE — ED Triage Notes (Signed)
Pt presents from home via POV. Reports fever (102F at home), productive cough (green), sore throat x 3days. Pt has taken nyquil at home. Denies known covid exposure. Denies shob and cp.

## 2019-07-12 NOTE — ED Notes (Signed)
Patient Alert and oriented to baseline. Stable and ambulatory to baseline. Patient verbalized understanding of the discharge instructions.  Patient belongings were taken by the patient.   

## 2019-07-12 NOTE — Discharge Instructions (Signed)
Strep test was negative.  Chest x-ray did not show any evidence of pneumonia.  COVID testing was obtained of results in approximately 3-4 days.  Please home isolate until this has resulted.  Take Tylenol as needed for fever.  Return to the ED for any new or worsening symptoms.

## 2019-07-14 LAB — NOVEL CORONAVIRUS, NAA (HOSP ORDER, SEND-OUT TO REF LAB; TAT 18-24 HRS): SARS-CoV-2, NAA: NOT DETECTED

## 2019-07-23 ENCOUNTER — Other Ambulatory Visit: Payer: Self-pay | Admitting: Advanced Practice Midwife

## 2019-07-26 ENCOUNTER — Telehealth: Payer: Self-pay | Admitting: Clinical

## 2019-07-26 DIAGNOSIS — F4322 Adjustment disorder with anxiety: Secondary | ICD-10-CM

## 2019-07-26 NOTE — Telephone Encounter (Signed)
Follow up after no-show appointment and [facility] medication management: Pt says she is taking the medication as prescribed, is having no noticeable side effects, and feels the medicine is making her feel "more balanced"and less anxious.  Pt agrees that she would like a referral to psychiatry for ongoing [place] medication management and therapy, and she also says she is "working on" finding a PCP.

## 2019-08-01 NOTE — Addendum Note (Signed)
Addended by: Vesta Mixer C on: 08/01/2019 10:16 AM   Modules accepted: Orders

## 2019-08-03 DIAGNOSIS — F331 Major depressive disorder, recurrent, moderate: Secondary | ICD-10-CM | POA: Diagnosis not present

## 2019-08-08 DIAGNOSIS — F331 Major depressive disorder, recurrent, moderate: Secondary | ICD-10-CM | POA: Diagnosis not present

## 2019-08-11 DIAGNOSIS — F331 Major depressive disorder, recurrent, moderate: Secondary | ICD-10-CM | POA: Diagnosis not present

## 2019-08-24 DIAGNOSIS — F331 Major depressive disorder, recurrent, moderate: Secondary | ICD-10-CM | POA: Diagnosis not present

## 2019-09-07 ENCOUNTER — Other Ambulatory Visit: Payer: Self-pay | Admitting: Obstetrics and Gynecology

## 2019-09-08 ENCOUNTER — Other Ambulatory Visit: Payer: Self-pay

## 2019-09-08 MED ORDER — CETIRIZINE HCL 10 MG PO TABS: 10.0000 mg | ORAL_TABLET | Freq: Every day | ORAL | 5 refills | Status: DC

## 2019-10-14 ENCOUNTER — Other Ambulatory Visit: Payer: Self-pay | Admitting: Obstetrics & Gynecology

## 2019-10-14 DIAGNOSIS — O165 Unspecified maternal hypertension, complicating the puerperium: Secondary | ICD-10-CM

## 2019-10-25 ENCOUNTER — Other Ambulatory Visit: Payer: Self-pay | Admitting: Obstetrics & Gynecology

## 2019-10-25 DIAGNOSIS — O9089 Other complications of the puerperium, not elsewhere classified: Secondary | ICD-10-CM

## 2019-11-05 ENCOUNTER — Other Ambulatory Visit: Payer: Self-pay | Admitting: Obstetrics & Gynecology

## 2019-11-05 DIAGNOSIS — O165 Unspecified maternal hypertension, complicating the puerperium: Secondary | ICD-10-CM

## 2020-01-25 DIAGNOSIS — R5383 Other fatigue: Secondary | ICD-10-CM | POA: Diagnosis not present

## 2020-01-25 DIAGNOSIS — Z03818 Encounter for observation for suspected exposure to other biological agents ruled out: Secondary | ICD-10-CM | POA: Diagnosis not present

## 2020-01-25 DIAGNOSIS — Z20828 Contact with and (suspected) exposure to other viral communicable diseases: Secondary | ICD-10-CM | POA: Diagnosis not present

## 2020-02-08 DIAGNOSIS — Z20828 Contact with and (suspected) exposure to other viral communicable diseases: Secondary | ICD-10-CM | POA: Diagnosis not present

## 2020-02-08 DIAGNOSIS — Z03818 Encounter for observation for suspected exposure to other biological agents ruled out: Secondary | ICD-10-CM | POA: Diagnosis not present

## 2020-03-05 ENCOUNTER — Other Ambulatory Visit: Payer: Self-pay | Admitting: Obstetrics and Gynecology

## 2020-04-27 ENCOUNTER — Other Ambulatory Visit: Payer: Self-pay

## 2020-04-27 ENCOUNTER — Encounter (HOSPITAL_COMMUNITY): Payer: Self-pay | Admitting: *Deleted

## 2020-04-27 ENCOUNTER — Emergency Department (HOSPITAL_COMMUNITY)
Admission: EM | Admit: 2020-04-27 | Discharge: 2020-04-27 | Disposition: A | Discharge status: Home / Self Care | Payer: Medicaid Other | Attending: Emergency Medicine | Admitting: Emergency Medicine

## 2020-04-27 DIAGNOSIS — F17210 Nicotine dependence, cigarettes, uncomplicated: Secondary | ICD-10-CM | POA: Diagnosis not present

## 2020-04-27 DIAGNOSIS — Z79899 Other long term (current) drug therapy: Secondary | ICD-10-CM | POA: Diagnosis not present

## 2020-04-27 DIAGNOSIS — Z20822 Contact with and (suspected) exposure to covid-19: Secondary | ICD-10-CM | POA: Diagnosis not present

## 2020-04-27 DIAGNOSIS — J45909 Unspecified asthma, uncomplicated: Secondary | ICD-10-CM | POA: Diagnosis not present

## 2020-04-27 LAB — SARS CORONAVIRUS 2 (TAT 6-24 HRS): SARS Coronavirus 2: NEGATIVE

## 2020-04-27 NOTE — ED Provider Notes (Signed)
[facility] EMERGENCY DEPARTMENT Provider Note   CSN: [id number] Arrival date & time: 04/27/20  1738     History No chief complaint on file.   Peggy [name] is a 34 y.o. female.  34 year old who presents for Covid testing.  Patient denies any symptoms.  No cough, no fever, no abdominal pain, no rhinorrhea.  Patient was exposed to Covid by a cousin that lives in the home.  The cousin tested positive yesterday.  The history is provided by the patient. No language interpreter was used.       Past Medical History:  Diagnosis Date  . Allergies   . Anemia   . Asthma   . 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   Past Surgical History:  Procedure Laterality Date  . GASTROSCHISIS CLOSURE    . TUBAL LIGATION Bilateral 05/24/2019   Procedure: POST PARTUM TUBAL LIGATION;  Surgeon: Truett Mainland, DO;  Location: [facility] LD ORS;  Service: Gynecology;  Laterality: Bilateral;     OB History    Gravida  4   Para  4   Term  1   Preterm  3   AB      Living  4     SAB      TAB      Ectopic      Multiple  0   Live Births  4           Family History  Problem Relation Age of Onset  . Hypertension Mother   . Arthritis Father   . Cancer Sister     Social History   Tobacco Use  . Smoking status: Current Some Day Smoker    Packs/day: 0.25    Last attempt to quit: 08/31/2018    Years since quitting: 1.6  . Smokeless tobacco: Never Used  Substance Use Topics  . Alcohol use: Not Currently  . Drug use: Not Currently    Home Medications Prior to Admission medications   Medication Sig Start Date End Date Taking? Authorizing Provider  busPIRone (BUSPAR) 10 MG tablet Take 1 tablet (10 mg total) by mouth 3 (three) times daily. 06/23/19   Anyanwu, Sallyanne Havers, MD  cetirizine (ZYRTEC) 10 MG tablet Take 1 tablet (10 mg total) by mouth daily. 09/08/19   Donnamae Jude, MD  enalapril (VASOTEC) 10 MG tablet  Take 1 tablet (10 mg total) by mouth daily. 06/21/19   Anyanwu, Sallyanne Havers, MD  hydrOXYzine (ATARAX/VISTARIL) 25 MG tablet Take 1-2 tablets (25-50 mg total) by mouth every 6 (six) hours as needed for anxiety. 06/23/19   Anyanwu, Sallyanne Havers, MD  ibuprofen (ADVIL) 800 MG tablet TAKE 1 TABLET (800 MG TOTAL) BY MOUTH EVERY 8 (EIGHT) HOURS AS NEEDED FOR HEADACHE OR CRAMPING. 10/26/19   Anyanwu, Sallyanne Havers, MD  Prenatal Multivit-Min-Fe-FA (PRENATAL VITAMINS) 0.8 MG tablet Take 1 tablet by mouth daily. 12/31/18   Leftwich-Kirby, Kathie Dike, CNM  PROAIR HFA 108 (90 Base) MCG/ACT inhaler INHALE 1-2 PUFFS INTO THE LUNGS EVERY 6 (SIX) HOURS AS NEEDED FOR WHEEZING OR SHORTNESS OF BREATH. 03/06/20   Aletha Halim, MD  ibuprofen (ADVIL) 800 MG tablet Take 1 tablet (800 mg total) by mouth every 8 (eight) hours as needed for headache or cramping. 06/21/19   Anyanwu, Sallyanne Havers, MD    Allergies    Patient has no known allergies.  Review of Systems   Review of Systems  All other systems  reviewed and are negative.   Physical Exam Updated Vital Signs There were no vitals taken for this visit.  Physical Exam Vitals and nursing note reviewed.  Constitutional:      Appearance: She is well-developed.  HENT:     Head: Normocephalic and atraumatic.     Right Ear: External ear normal.     Left Ear: External ear normal.  Eyes:     Conjunctiva/sclera: Conjunctivae normal.  Cardiovascular:     Rate and Rhythm: Normal rate.     Heart sounds: Normal heart sounds.  Pulmonary:     Effort: Pulmonary effort is normal.     Breath sounds: Normal breath sounds.  Abdominal:     General: Bowel sounds are normal.     Palpations: Abdomen is soft.     Tenderness: There is no abdominal tenderness. There is no rebound.  Musculoskeletal:        General: Normal range of motion.     Cervical back: Normal range of motion and neck supple.  Skin:    General: Skin is warm.     Capillary Refill: Capillary refill takes less than 2 seconds.    Neurological:     General: No focal deficit present.     Mental Status: She is alert and oriented to person, place, and time.     ED Results / Procedures / Treatments   Labs (all labs ordered are listed, but only abnormal results are displayed) Labs Reviewed  SARS CORONAVIRUS 2 (TAT 6-24 HRS)    EKG None  Radiology No results found.  Procedures Procedures (including critical care time)  Medications Ordered in ED Medications - No data to display  ED Course  I have reviewed the triage vital signs and the nursing notes.  Pertinent labs & imaging results that were available during my care of the patient were reviewed by me and considered in my medical decision making (see chart for details).    MDM Rules/Calculators/A&P                      34 year old who presents for Covid testing.  No symptoms at this time.  No cough, no rhinorrhea, no fever, no abdominal pain.   Will obtain covid testing.  Gust need to quarantine until results are back.  Discussed signs that warrant reevaluation  Peggy [name] was evaluated in Emergency Department on 04/27/2020 for the symptoms described in the history of present illness. She was evaluated in the context of the global COVID-19 pandemic, which necessitated consideration that the patient might be at risk for infection with the SARS-CoV-2 virus that causes COVID-19. Institutional protocols and algorithms that pertain to the evaluation of patients at risk for COVID-19 are in a state of rapid change based on information released by regulatory bodies including the CDC and federal and state organizations. These policies and algorithms were followed during the patient's care in the ED.    Final Clinical Impression(s) / ED Diagnoses Final diagnoses:  Exposure to COVID-19 virus    Rx / DC Orders ED Discharge Orders    None       Niel Hummer, MD 04/27/20 1755

## 2020-04-27 NOTE — ED Triage Notes (Signed)
Pt states niece has covid and needs to be tested

## 2020-05-02 DIAGNOSIS — Z03818 Encounter for observation for suspected exposure to other biological agents ruled out: Secondary | ICD-10-CM | POA: Diagnosis not present

## 2020-05-09 ENCOUNTER — Encounter (HOSPITAL_COMMUNITY): Payer: Self-pay

## 2020-05-09 ENCOUNTER — Emergency Department (HOSPITAL_COMMUNITY)
Admission: EM | Admit: 2020-05-09 | Discharge: 2020-05-09 | Disposition: A | Discharge status: Home / Self Care | Payer: Medicaid Other | Attending: Emergency Medicine | Admitting: Emergency Medicine

## 2020-05-09 ENCOUNTER — Other Ambulatory Visit: Payer: Self-pay

## 2020-05-09 DIAGNOSIS — J45909 Unspecified asthma, uncomplicated: Secondary | ICD-10-CM | POA: Diagnosis not present

## 2020-05-09 DIAGNOSIS — Z03818 Encounter for observation for suspected exposure to other biological agents ruled out: Secondary | ICD-10-CM | POA: Diagnosis not present

## 2020-05-09 DIAGNOSIS — F17210 Nicotine dependence, cigarettes, uncomplicated: Secondary | ICD-10-CM | POA: Insufficient documentation

## 2020-05-09 DIAGNOSIS — Z20822 Contact with and (suspected) exposure to covid-19: Secondary | ICD-10-CM | POA: Diagnosis not present

## 2020-05-09 MED ORDER — ALBUTEROL SULFATE HFA 108 (90 BASE) MCG/ACT IN AERS
2.0000 | INHALATION_SPRAY | Freq: Once | RESPIRATORY_TRACT | Status: AC
  Administered 2020-05-09: 21:00:00 2 via RESPIRATORY_TRACT
  Filled 2020-05-09: qty 6.7

## 2020-05-09 NOTE — ED Provider Notes (Signed)
[facility] EMERGENCY DEPARTMENT Provider Note   CSN: [id number] Arrival date & time: 05/09/20  1816     History Chief Complaint  Patient presents with  . Covid Exposure    Peggy [name] is a 34 y.o. female who presents to the ED for a chief complaint of COVID-19 exposure. The patient states her mother and niece were positive for Covid-19 3 days ago. She is concerned that she may have been exposed.  She denies any symptoms to include fever, rash, vomiting, diarrhea, cough, nasal congestion, rhinorrhea, or fatigue.  The patient reports normal appetite. She reports she has a history of asthma. Mother reports she is prescribed an inhaler but is out of it and is requesting a refill.    Past Medical History:  Diagnosis Date  . Allergies   . Anemia   . Asthma   . 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   Past Surgical History:  Procedure Laterality Date  . GASTROSCHISIS CLOSURE    . TUBAL LIGATION Bilateral 05/24/2019   Procedure: POST PARTUM TUBAL LIGATION;  Surgeon: Levie Heritage, DO;  Location: [facility] LD ORS;  Service: Gynecology;  Laterality: Bilateral;     OB History    Gravida  4   Para  4   Term  1   Preterm  3   AB      Living  4     SAB      TAB      Ectopic      Multiple  0   Live Births  4           Family History  Problem Relation Age of Onset  . Hypertension Mother   . Arthritis Father   . Cancer Sister     Social History   Tobacco Use  . Smoking status: Current Some Day Smoker    Packs/day: 0.25    Last attempt to quit: 08/31/2018    Years since quitting: 1.6  . Smokeless tobacco: Never Used  Substance Use Topics  . Alcohol use: Not Currently  . Drug use: Not Currently    Home Medications Prior to Admission medications   Medication Sig Start Date End Date Taking? Authorizing Provider  busPIRone (BUSPAR) 10 MG tablet Take 1 tablet (10 mg total) by mouth 3 (three)  times daily. 06/23/19   Anyanwu, Jethro Bastos, MD  cetirizine (ZYRTEC) 10 MG tablet Take 1 tablet (10 mg total) by mouth daily. 09/08/19   Reva Bores, MD  enalapril (VASOTEC) 10 MG tablet Take 1 tablet (10 mg total) by mouth daily. 06/21/19   Anyanwu, Jethro Bastos, MD  hydrOXYzine (ATARAX/VISTARIL) 25 MG tablet Take 1-2 tablets (25-50 mg total) by mouth every 6 (six) hours as needed for anxiety. 06/23/19   Anyanwu, Jethro Bastos, MD  ibuprofen (ADVIL) 800 MG tablet TAKE 1 TABLET (800 MG TOTAL) BY MOUTH EVERY 8 (EIGHT) HOURS AS NEEDED FOR HEADACHE OR CRAMPING. 10/26/19   Anyanwu, Jethro Bastos, MD  Prenatal Multivit-Min-Fe-FA (PRENATAL VITAMINS) 0.8 MG tablet Take 1 tablet by mouth daily. 12/31/18   Leftwich-Kirby, Wilmer Floor, CNM  PROAIR HFA 108 (90 Base) MCG/ACT inhaler INHALE 1-2 PUFFS INTO THE LUNGS EVERY 6 (SIX) HOURS AS NEEDED FOR WHEEZING OR SHORTNESS OF BREATH. 03/06/20   Shannon Bing, MD  ibuprofen (ADVIL) 800 MG tablet Take 1 tablet (800 mg total) by mouth every 8 (eight) hours as needed for headache or cramping. 06/21/19  Tereso Newcomer, MD    Allergies    Patient has no known allergies.  Review of Systems   Review of Systems  Constitutional: Negative for activity change and fever.  HENT: Negative for congestion and trouble swallowing.   Eyes: Negative for discharge and redness.  Respiratory: Negative for cough and wheezing.   Cardiovascular: Negative for chest pain.  Gastrointestinal: Negative for diarrhea and vomiting.  Genitourinary: Negative for decreased urine volume and dysuria.  Musculoskeletal: Negative for gait problem and neck stiffness.  Skin: Negative for rash and wound.  Neurological: Negative for seizures and syncope.  Hematological: Does not bruise/bleed easily.  All other systems reviewed and are negative.   Physical Exam Updated Vital Signs BP 96/67   Pulse 67   Temp 98.4 F (36.9 C) (Oral)   Resp 17   Wt 104 lb 15 oz (47.6 kg)   LMP 04/10/2020 (Approximate)   SpO2 100%    BMI 21.20 kg/m   Physical Exam Vitals and nursing note reviewed.  Constitutional:      General: She is not in acute distress.    Appearance: She is well-developed.  HENT:     Head: Normocephalic and atraumatic.     Nose: Nose normal.  Eyes:     Conjunctiva/sclera: Conjunctivae normal.  Cardiovascular:     Rate and Rhythm: Normal rate and regular rhythm.     Pulses: Normal pulses.     Heart sounds: Normal heart sounds.  Pulmonary:     Effort: Pulmonary effort is normal. No respiratory distress.     Breath sounds: No wheezing, rhonchi or rales.  Abdominal:     General: There is no distension.     Palpations: Abdomen is soft.  Musculoskeletal:        General: Normal range of motion.     Cervical back: Normal range of motion and neck supple.  Skin:    General: Skin is warm.     Capillary Refill: Capillary refill takes less than 2 seconds.     Findings: No rash.  Neurological:     Mental Status: She is alert and oriented to person, place, and time.     ED Results / Procedures / Treatments   Labs (all labs ordered are listed, but only abnormal results are displayed) Labs Reviewed - No data to display  EKG None  Radiology No results found.  Procedures Procedures (including critical care time)  Medications Ordered in ED Medications  albuterol (VENTOLIN HFA) 108 (90 Base) MCG/ACT inhaler 2 puff (has no administration in time range)    ED Course  I have reviewed the triage vital signs and the nursing notes.  Pertinent labs & imaging results that were available during my care of the patient were reviewed by me and considered in my medical decision making (see chart for details).     34 y.o. female who presents with multiple family members for suspected COVID-19 exposure. Afebrile on arrival. VSS. Patient denies symptoms of acute illness. She does have a history of asthma and says she has required albuterol intermittently, so will give albuterol MDI now. Will send  COVID-19 test and recommended close monitoring at home, isolation and follow up with PCP.   Peggy [name] was evaluated in Emergency Department on 05/09/2020 for the symptoms described in the history of present illness. She was evaluated in the context of the global COVID-19 pandemic, which necessitated consideration that the patient might be at risk for infection with the SARS-CoV-2 virus that causes COVID-19.  Institutional protocols and algorithms that pertain to the evaluation of patients at risk for COVID-19 are in a state of rapid change based on information released by regulatory bodies including the CDC and federal and state organizations. These policies and algorithms were followed during the patient's care in the ED.   Final Clinical Impression(s) / ED Diagnoses Final diagnoses:  Suspected COVID-19 virus infection    Rx / DC Orders ED Discharge Orders    None     Scribe's Attestation: Rosalva Ferron, MD obtained and performed the history, physical exam and medical decision making elements that were entered into the chart. Documentation assistance was provided by me personally, a scribe. Signed by Cristal Generous, Scribe on 05/09/2020 9:22 PM ? Documentation assistance provided by the scribe. I was present during the time the encounter was recorded. The information recorded by the scribe was done at my direction and has been reviewed and validated by me.  Willadean Carol, MD 05/09/2020 2130    Willadean Carol, MD 05/20/20 1316

## 2020-05-09 NOTE — ED Triage Notes (Signed)
Pt c/o sore throat x 2 days.  Denies fevers.  Reports COVID exposure.  Son was diagnosed on 4/30.  Reports being lightheaded.  Pt a/o x 4.  No meds PTA

## 2020-05-09 NOTE — ED Notes (Signed)
Patient verbalizes understanding of discharge instructions. Opportunity for questioning and answers were provided. Armband removed by staff, pt discharged from ED. Pt. ambulatory and discharged home.  

## 2020-05-09 NOTE — ED Notes (Signed)
Called previously by green tech no reply. Called again by this EMT x4 no reply. Not seen in lobby.

## 2020-05-10 ENCOUNTER — Other Ambulatory Visit: Payer: Self-pay | Admitting: Obstetrics & Gynecology

## 2020-05-10 DIAGNOSIS — O165 Unspecified maternal hypertension, complicating the puerperium: Secondary | ICD-10-CM

## 2020-05-10 LAB — SARS CORONAVIRUS 2 (TAT 6-24 HRS): SARS Coronavirus 2: NEGATIVE

## 2020-05-21 DIAGNOSIS — Z03818 Encounter for observation for suspected exposure to other biological agents ruled out: Secondary | ICD-10-CM | POA: Diagnosis not present

## 2020-06-19 ENCOUNTER — Other Ambulatory Visit: Payer: Self-pay | Admitting: Family Medicine

## 2020-06-19 ENCOUNTER — Other Ambulatory Visit: Payer: Self-pay | Admitting: Obstetrics & Gynecology

## 2020-06-19 DIAGNOSIS — O165 Unspecified maternal hypertension, complicating the puerperium: Secondary | ICD-10-CM

## 2020-09-10 DIAGNOSIS — Z20822 Contact with and (suspected) exposure to covid-19: Secondary | ICD-10-CM | POA: Diagnosis not present

## 2020-09-27 DIAGNOSIS — Z20822 Contact with and (suspected) exposure to covid-19: Secondary | ICD-10-CM | POA: Diagnosis not present

## 2020-11-04 ENCOUNTER — Other Ambulatory Visit: Payer: Self-pay | Admitting: Family Medicine

## 2020-12-19 DIAGNOSIS — Z20822 Contact with and (suspected) exposure to covid-19: Secondary | ICD-10-CM | POA: Diagnosis not present

## 2021-02-06 DIAGNOSIS — Z03818 Encounter for observation for suspected exposure to other biological agents ruled out: Secondary | ICD-10-CM | POA: Diagnosis not present

## 2021-02-06 DIAGNOSIS — Z20822 Contact with and (suspected) exposure to covid-19: Secondary | ICD-10-CM | POA: Diagnosis not present

## 2021-04-06 ENCOUNTER — Other Ambulatory Visit: Payer: Self-pay

## 2021-04-06 ENCOUNTER — Emergency Department
Admission: EM | Admit: 2021-04-06 | Discharge: 2021-04-06 | Disposition: A | Discharge status: Home / Self Care | Payer: Medicaid Other | Attending: Emergency Medicine | Admitting: Emergency Medicine

## 2021-04-06 ENCOUNTER — Emergency Department: Payer: Medicaid Other

## 2021-04-06 DIAGNOSIS — R0789 Other chest pain: Secondary | ICD-10-CM | POA: Diagnosis not present

## 2021-04-06 DIAGNOSIS — F17210 Nicotine dependence, cigarettes, uncomplicated: Secondary | ICD-10-CM | POA: Diagnosis not present

## 2021-04-06 DIAGNOSIS — R200 Anesthesia of skin: Secondary | ICD-10-CM | POA: Insufficient documentation

## 2021-04-06 DIAGNOSIS — B349 Viral infection, unspecified: Secondary | ICD-10-CM | POA: Insufficient documentation

## 2021-04-06 DIAGNOSIS — J45909 Unspecified asthma, uncomplicated: Secondary | ICD-10-CM | POA: Diagnosis not present

## 2021-04-06 DIAGNOSIS — R079 Chest pain, unspecified: Secondary | ICD-10-CM | POA: Diagnosis not present

## 2021-04-06 DIAGNOSIS — M7918 Myalgia, other site: Secondary | ICD-10-CM | POA: Diagnosis present

## 2021-04-06 DIAGNOSIS — Z20822 Contact with and (suspected) exposure to covid-19: Secondary | ICD-10-CM | POA: Diagnosis not present

## 2021-04-06 DIAGNOSIS — R531 Weakness: Secondary | ICD-10-CM | POA: Diagnosis not present

## 2021-04-06 LAB — CBC WITH DIFFERENTIAL/PLATELET
Abs Immature Granulocytes: 0.02 10*3/uL (ref 0.00–0.07)
Basophils Absolute: 0 10*3/uL (ref 0.0–0.1)
Basophils Relative: 1 %
Eosinophils Absolute: 0.1 10*3/uL (ref 0.0–0.5)
Eosinophils Relative: 1 %
HCT: 38.8 % (ref 36.0–46.0)
Hemoglobin: 13.9 g/dL (ref 12.0–15.0)
Immature Granulocytes: 0 %
Lymphocytes Relative: 18 %
Lymphs Abs: 1.5 10*3/uL (ref 0.7–4.0)
MCH: 31.1 pg (ref 26.0–34.0)
MCHC: 35.8 g/dL (ref 30.0–36.0)
MCV: 86.8 fL (ref 80.0–100.0)
Monocytes Absolute: 0.6 10*3/uL (ref 0.1–1.0)
Monocytes Relative: 7 %
Neutro Abs: 6.1 10*3/uL (ref 1.7–7.7)
Neutrophils Relative %: 73 %
Platelets: 213 10*3/uL (ref 150–400)
RBC: 4.47 MIL/uL (ref 3.87–5.11)
RDW: 12 % (ref 11.5–15.5)
WBC: 8.2 10*3/uL (ref 4.0–10.5)
nRBC: 0 % (ref 0.0–0.2)

## 2021-04-06 LAB — URINALYSIS, COMPLETE (UACMP) WITH MICROSCOPIC
Bacteria, UA: NONE SEEN
Bilirubin Urine: NEGATIVE
Glucose, UA: NEGATIVE mg/dL
Hgb urine dipstick: NEGATIVE
Ketones, ur: NEGATIVE mg/dL
Leukocytes,Ua: NEGATIVE
Nitrite: NEGATIVE
Protein, ur: NEGATIVE mg/dL
Specific Gravity, Urine: 1.011 (ref 1.005–1.030)
Squamous Epithelial / HPF: NONE SEEN (ref 0–5)
pH: 5 (ref 5.0–8.0)

## 2021-04-06 LAB — COMPREHENSIVE METABOLIC PANEL WITH GFR
ALT: 11 U/L (ref 0–44)
AST: 28 U/L (ref 15–41)
Albumin: 4.3 g/dL (ref 3.5–5.0)
Alkaline Phosphatase: 63 U/L (ref 38–126)
Anion gap: 9 (ref 5–15)
BUN: 8 mg/dL (ref 6–20)
CO2: 21 mmol/L — ABNORMAL LOW (ref 22–32)
Calcium: 8.9 mg/dL (ref 8.9–10.3)
Chloride: 107 mmol/L (ref 98–111)
Creatinine, Ser: 0.77 mg/dL (ref 0.44–1.00)
GFR, Estimated: >60 mL/min
Glucose, Bld: 99 mg/dL (ref 70–99)
Potassium: 3.8 mmol/L (ref 3.5–5.1)
Sodium: 137 mmol/L (ref 135–145)
Total Bilirubin: 0.5 mg/dL (ref 0.3–1.2)
Total Protein: 7.5 g/dL (ref 6.5–8.1)

## 2021-04-06 LAB — TROPONIN I (HIGH SENSITIVITY)
Troponin I (High Sensitivity): 3 ng/L
Troponin I (High Sensitivity): 5 ng/L (ref <18)

## 2021-04-06 LAB — RESP PANEL BY RT-PCR (FLU A&B, COVID) ARPGX2
Influenza A by PCR: NEGATIVE
Influenza B by PCR: NEGATIVE
SARS Coronavirus 2 by RT PCR: NEGATIVE

## 2021-04-06 LAB — D-DIMER, QUANTITATIVE: D-Dimer, Quant: 0.3 ug/mL-FEU (ref 0.00–0.50)

## 2021-04-06 LAB — GROUP A STREP BY PCR: Group A Strep by PCR: NOT DETECTED

## 2021-04-06 LAB — MAGNESIUM: Magnesium: 2.1 mg/dL (ref 1.7–2.4)

## 2021-04-06 LAB — POC URINE PREG, ED: Preg Test, Ur: NEGATIVE

## 2021-04-06 LAB — MONONUCLEOSIS SCREEN: Mono Screen: NEGATIVE

## 2021-04-06 MED ORDER — KETOROLAC TROMETHAMINE 30 MG/ML IJ SOLN
15.0000 mg | Freq: Once | INTRAMUSCULAR | Status: AC
  Administered 2021-04-06: 15 mg via INTRAVENOUS
  Filled 2021-04-06: qty 1

## 2021-04-06 MED ORDER — SODIUM CHLORIDE 0.9 % IV BOLUS
1000.0000 mL | Freq: Once | INTRAVENOUS | Status: AC
  Administered 2021-04-06: 1000 mL via INTRAVENOUS

## 2021-04-06 NOTE — ED Triage Notes (Signed)
Chest tightness, generalized body aches, fatigue, bilateral ear pain, and bilateral arm numbness. All symptom acute onset this AM 1130. Denies fever or chills. Denies known sick exposure. Pt neurologically intact without deficit with exception of bilateral arm numbness. CSM intact and full ROM noted. Pt following all commands appropriately. Denies h/a or vision change. Denies n/v. Pt alert and oriented following commands appropriately. Breathing unlabored with symmetric chest rise and fall.

## 2021-04-06 NOTE — Discharge Instructions (Signed)
Drink plenty of fluids daily.  You may take Tylenol and/or Ibuprofen as needed for body aches.  Return to the ER for worsening symptoms, persistent vomiting, difficulty breathing or other concerns

## 2021-04-06 NOTE — ED Provider Notes (Signed)
[facility] Emergency Department Provider Note   ____________________________________________   Event Date/Time   First MD Initiated Contact with Patient 04/06/21 0221     (approximate)  I have reviewed the triage vital signs and the nursing notes.   HISTORY  Chief Complaint Chest Pain, Generalized Body Aches, and Numbness    HPI Peggy [name] is a 35 y.o. female who presents to the ED from home with multiple medical complaints.  Patient awoke yesterday morning with generalized malaise.  Endorses fever, chills, body aches, fatigue, nasal congestion, bilateral ear pain, cough, chest tightness, bilateral wrist and hand numbness, bilateral feet numbness.  Denies extremity weakness.  Denies shortness of breath, abdominal pain, nausea, vomiting, dysuria or diarrhea.  Denies recent travel, trauma or hormone use.  Patient is vaccinated and boosted against COVID-19.  Took an antigen test yesterday which was negative.     Past Medical History:  Diagnosis Date  . Allergies   . Anemia   . Asthma   . 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   Past Surgical History:  Procedure Laterality Date  . GASTROSCHISIS CLOSURE    . TUBAL LIGATION Bilateral 05/24/2019   Procedure: POST PARTUM TUBAL LIGATION;  Surgeon: Levie Heritage, DO;  Location: [facility] LD ORS;  Service: Gynecology;  Laterality: Bilateral;    Prior to Admission medications   Medication Sig Start Date End Date Taking? Authorizing Provider  busPIRone (BUSPAR) 10 MG tablet Take 1 tablet (10 mg total) by mouth 3 (three) times daily. 06/23/19   Anyanwu, Jethro Bastos, MD  cetirizine (ZYRTEC) 10 MG tablet TAKE 1 TABLET BY MOUTH EVERY DAY 06/19/20   Reva Bores, MD  enalapril (VASOTEC) 10 MG tablet Take 1 tablet (10 mg total) by mouth daily. 06/21/19   Anyanwu, Jethro Bastos, MD  hydrOXYzine (ATARAX/VISTARIL) 25 MG tablet Take 1-2 tablets (25-50 mg total) by mouth every 6  (six) hours as needed for anxiety. 06/23/19   Anyanwu, Jethro Bastos, MD  ibuprofen (ADVIL) 800 MG tablet TAKE 1 TABLET (800 MG TOTAL) BY MOUTH EVERY 8 (EIGHT) HOURS AS NEEDED FOR HEADACHE OR CRAMPING. 10/26/19   Anyanwu, Jethro Bastos, MD  Prenatal Multivit-Min-Fe-FA (PRENATAL VITAMINS) 0.8 MG tablet Take 1 tablet by mouth daily. 12/31/18   Leftwich-Kirby, Wilmer Floor, CNM  PROAIR HFA 108 (90 Base) MCG/ACT inhaler INHALE 1-2 PUFFS INTO THE LUNGS EVERY 6 (SIX) HOURS AS NEEDED FOR WHEEZING OR SHORTNESS OF BREATH. 03/06/20   [place] Bing, MD    Allergies Amoxicillin  Family History  Problem Relation Age of Onset  . Hypertension Mother   . Arthritis Father   . Cancer Sister     Social History Social History   Tobacco Use  . Smoking status: Current Some Day Smoker    Packs/day: 0.25    Last attempt to quit: 08/31/2018    Years since quitting: 2.6  . Smokeless tobacco: Never Used  Vaping Use  . Vaping Use: Former  Substance Use Topics  . Alcohol use: Not Currently  . Drug use: Not Currently    Review of Systems  Constitutional: Positive for generalized malaise, body aches and fever/chills. Eyes: No visual changes. ENT: Positive for bilateral ear pain.  No sore throat. Cardiovascular: Positive for chest pain. Respiratory: Denies shortness of breath. Gastrointestinal: No abdominal pain.  No nausea, no vomiting.  No diarrhea.  No constipation. Genitourinary: Negative for dysuria. Musculoskeletal: Negative for neck or back pain. Skin: Negative  for rash. Neurological: Negative for headaches, focal weakness.  Positive for bilateral hands and feet numbness.   ____________________________________________   PHYSICAL EXAM:  VITAL SIGNS: ED Triage Vitals  Enc Vitals Group     BP 04/06/21 0013 122/84     Pulse Rate 04/06/21 0013 77     Resp 04/06/21 0013 18     Temp 04/06/21 0013 98.1 F (36.7 C)     Temp Source 04/06/21 0013 Oral     SpO2 04/06/21 0013 97 %     Weight 04/06/21 0008 104 lb  (47.2 kg)     Height 04/06/21 0008 4\' 11"  (1.499 m)     Head Circumference --      Peak Flow --      Pain Score 04/06/21 0008 7     Pain Loc --      Pain Edu? --      Excl. in GC? --     Constitutional: Alert and oriented. Well appearing and in no acute distress. Eyes: Conjunctivae are normal. PERRL. EOMI. Head: Atraumatic. Ears: Bilateral TM dullness. Nose: Congestion/rhinnorhea. Mouth/Throat: Mucous membranes are mildly dry.  Oropharynx mildly erythematous without tonsillar swelling, exudates or peritonsillar abscess.  There is no hoarse or muffled voice.  There is no drooling. Neck: No stridor.  No carotid bruits.  Supple neck without meningismus Cardiovascular: Normal rate, regular rhythm. Grossly normal heart sounds.  Good peripheral circulation BUE and BLE. Respiratory: Normal respiratory effort.  No retractions. Lungs CTAB. Gastrointestinal: Soft and nontender to light or deep palpation. No distention. No abdominal bruits. No CVA tenderness. Musculoskeletal: No spinal tenderness to palpation.  No lower extremity tenderness nor edema.  No joint effusions. Neurologic: Alert and oriented x3.  CN II to XII grossly intact.  Normal speech and language.  5/5 motor strength all extremities.  Subjective decreased sensation to bilateral hands and feet.  No gross focal neurologic deficits are appreciated. No gait instability. Skin:  Skin is warm, dry and intact. No rash noted.  No petechiae. Psychiatric: Mood and affect are normal. Speech and behavior are normal.  ____________________________________________   LABS (all labs ordered are listed, but only abnormal results are displayed)  Labs Reviewed  COMPREHENSIVE METABOLIC PANEL - Abnormal; Notable for the following components:      Result Value   CO2 21 (*)    All other components within normal limits  URINALYSIS, COMPLETE (UACMP) WITH MICROSCOPIC - Abnormal; Notable for the following components:   Color, Urine YELLOW (*)     APPearance CLEAR (*)    All other components within normal limits  RESP PANEL BY RT-PCR (FLU A&B, COVID) ARPGX2  GROUP A STREP BY PCR  CBC WITH DIFFERENTIAL/PLATELET  MONONUCLEOSIS SCREEN  D-DIMER, QUANTITATIVE (NOT AT [facility])  MAGNESIUM  POC URINE PREG, ED  TROPONIN I (HIGH SENSITIVITY)  TROPONIN I (HIGH SENSITIVITY)   ____________________________________________  EKG  ED ECG REPORT I, Riyansh Gerstner J, the attending physician, personally viewed and interpreted this ECG.   Date: 04/06/2021  EKG Time: 0010  Rate: 76  Rhythm: normal EKG, normal sinus rhythm  Axis: Normal  Intervals:none  ST&T Change: Nonspecific  ____________________________________________  RADIOLOGY I, Marisel Tostenson J, personally viewed and evaluated these images (plain radiographs) as part of my medical decision making, as well as reviewing the written report by the radiologist.  ED MD interpretation: No acute cardiopulmonary process  Official radiology report(s): DG Chest 2 View  Result Date: 04/06/2021 CLINICAL DATA:  Chest pain EXAM: CHEST - 2 VIEW COMPARISON:  07/12/2019 FINDINGS: The heart size and mediastinal contours are within normal limits. Both lungs are clear. The visualized skeletal structures are unremarkable. Nodular symmetric opacities over the lower lungs consistent with nipple shadows. IMPRESSION: No active cardiopulmonary disease. Electronically Signed   By: Jasmine Pang M.D.   On: 04/06/2021 01:06    ____________________________________________   PROCEDURES  Procedure(s) performed (including Critical Care):  Procedures   ____________________________________________   INITIAL IMPRESSION / ASSESSMENT AND PLAN / ED COURSE  As part of my medical decision making, I reviewed the following data within the electronic MEDICAL RECORD NUMBER Nursing notes reviewed and incorporated, Labs reviewed, EKG interpreted, Old chart reviewed, Radiograph reviewed and Notes from prior ED visits     35 year old  female presenting with multiple medical complaints including myalgias, congestion, chest tightness, bilateral hands and feet numbness.  Differential diagnosis includes but is not limited to viral process such as COVID-19, mononucleosis, community-acquired pneumonia, ACS, PE, aortic dissection, UTI, etc.  No focal neurological deficits on examination.  CVA highly unlikely.  Etiology of bilateral hands and feet numbness unclear but does not follow dermatome distribution.  Will check magnesium.  Will initiate IV fluid resuscitation, IV Toradol for body aches.  Will check repeat troponin, RST, respiratory panel, UA and reassess.  Clinical Course as of 04/06/21 0729  Sat Apr 06, 2021  0506 Updated patient on all test results.  She is feeling significantly better after IV fluids and Toradol.  No focal neurological deficits on reexamination.  Encouraged pushing fluids, alternating Tylenol and Ibuprofen as needed for fever and body aches.  Strict return precautions given.  Patient verbalizes understanding agrees with plan of care. [JS]    Clinical Course User Index [JS] Irean Hong, MD     ____________________________________________   FINAL CLINICAL IMPRESSION(S) / ED DIAGNOSES  Final diagnoses:  Viral illness     ED Discharge Orders    None      *Please note:  Shayne Deerman was evaluated in Emergency Department on 04/06/2021 for the symptoms described in the history of present illness. She was evaluated in the context of the global COVID-19 pandemic, which necessitated consideration that the patient might be at risk for infection with the SARS-CoV-2 virus that causes COVID-19. Institutional protocols and algorithms that pertain to the evaluation of patients at risk for COVID-19 are in a state of rapid change based on information released by regulatory bodies including the CDC and federal and state organizations. These policies and algorithms were followed during the patient's care in the ED.   Some ED evaluations and interventions may be delayed as a result of limited staffing during and the pandemic.*   Note:  This document was prepared using Dragon voice recognition software and may include unintentional dictation errors.   Irean Hong, MD 04/06/21 [phone]

## 2021-04-08 ENCOUNTER — Telehealth: Payer: Self-pay

## 2021-04-08 NOTE — Telephone Encounter (Signed)
Transition Care Management Unsuccessful Follow-up Telephone Call  Date of discharge and from where:  04/06/2021 from [facility]  Attempts:  1st Attempt  Reason for unsuccessful TCM follow-up call:  Left voice message     

## 2021-04-08 NOTE — Telephone Encounter (Signed)
Transition Care Management Follow-up Telephone Call  Date of discharge and from where: 04/06/2021 from [facility]  How have you been since you were released from the hospital? Pt stated that she is feeling the same.   Any questions or concerns? No  Items Reviewed:  Did the pt receive and understand the discharge instructions provided? Yes   Medications obtained and verified? Yes   Other? No   Any new allergies since your discharge? No   Dietary orders reviewed? n/a  Do you have support at home? Yes   Functional Questionnaire: (I = Independent and D = Dependent) ADLs: I  Bathing/Dressing- I  Meal Prep- I  Eating- I  Maintaining continence- I  Transferring/Ambulation- I  Managing Meds- I   Follow up appointments reviewed:   PCP Hospital f/u appt confirmed? No    Specialist Hospital f/u appt confirmed? No    Are transportation arrangements needed? No   If their condition worsens, is the pt aware to call PCP or go to the Emergency Dept.? Yes  Was the patient provided with contact information for the PCP's office or ED? Yes  Was to pt encouraged to call back with questions or concerns? Yes

## 2021-05-06 ENCOUNTER — Other Ambulatory Visit: Payer: Self-pay

## 2021-05-06 ENCOUNTER — Emergency Department
Admission: EM | Admit: 2021-05-06 | Discharge: 2021-05-06 | Disposition: A | Discharge status: Home / Self Care | Payer: Medicaid Other | Attending: Emergency Medicine | Admitting: Emergency Medicine

## 2021-05-06 ENCOUNTER — Emergency Department: Payer: Medicaid Other

## 2021-05-06 DIAGNOSIS — J45909 Unspecified asthma, uncomplicated: Secondary | ICD-10-CM | POA: Insufficient documentation

## 2021-05-06 DIAGNOSIS — F17210 Nicotine dependence, cigarettes, uncomplicated: Secondary | ICD-10-CM | POA: Insufficient documentation

## 2021-05-06 DIAGNOSIS — N61 Mastitis without abscess: Secondary | ICD-10-CM | POA: Diagnosis not present

## 2021-05-06 DIAGNOSIS — N649 Disorder of breast, unspecified: Secondary | ICD-10-CM

## 2021-05-06 DIAGNOSIS — N644 Mastodynia: Secondary | ICD-10-CM | POA: Diagnosis not present

## 2021-05-06 DIAGNOSIS — N63 Unspecified lump in unspecified breast: Secondary | ICD-10-CM | POA: Insufficient documentation

## 2021-05-06 DIAGNOSIS — N611 Abscess of the breast and nipple: Secondary | ICD-10-CM | POA: Diagnosis not present

## 2021-05-06 LAB — COMPREHENSIVE METABOLIC PANEL
ALT: 9 U/L (ref 0–44)
AST: 20 U/L (ref 15–41)
Albumin: 4.7 g/dL (ref 3.5–5.0)
Alkaline Phosphatase: 70 U/L (ref 38–126)
Anion gap: 8 (ref 5–15)
BUN: 8 mg/dL (ref 6–20)
CO2: 25 mmol/L (ref 22–32)
Calcium: 9.1 mg/dL (ref 8.9–10.3)
Chloride: 102 mmol/L (ref 98–111)
Creatinine, Ser: 0.72 mg/dL (ref 0.44–1.00)
GFR, Estimated: >60 mL/min (ref >60)
Glucose, Bld: 88 mg/dL (ref 70–99)
Potassium: 4.1 mmol/L (ref 3.5–5.1)
Sodium: 135 mmol/L (ref 135–145)
Total Bilirubin: 0.9 mg/dL (ref 0.3–1.2)
Total Protein: 8.1 g/dL (ref 6.5–8.1)

## 2021-05-06 LAB — CBC WITH DIFFERENTIAL/PLATELET
Abs Immature Granulocytes: 0.03 10*3/uL (ref 0.00–0.07)
Basophils Absolute: 0 10*3/uL (ref 0.0–0.1)
Basophils Relative: 1 %
Eosinophils Absolute: 0.1 10*3/uL (ref 0.0–0.5)
Eosinophils Relative: 2 %
HCT: 42.2 % (ref 36.0–46.0)
Hemoglobin: 14.3 g/dL (ref 12.0–15.0)
Immature Granulocytes: 0 %
Lymphocytes Relative: 28 %
Lymphs Abs: 2.2 10*3/uL (ref 0.7–4.0)
MCH: 30.4 pg (ref 26.0–34.0)
MCHC: 33.9 g/dL (ref 30.0–36.0)
MCV: 89.6 fL (ref 80.0–100.0)
Monocytes Absolute: 0.6 10*3/uL (ref 0.1–1.0)
Monocytes Relative: 7 %
Neutro Abs: 5 10*3/uL (ref 1.7–7.7)
Neutrophils Relative %: 62 %
Platelets: 248 10*3/uL (ref 150–400)
RBC: 4.71 MIL/uL (ref 3.87–5.11)
RDW: 12.3 % (ref 11.5–15.5)
WBC: 8 10*3/uL (ref 4.0–10.5)
nRBC: 0 % (ref 0.0–0.2)

## 2021-05-06 LAB — POC URINE PREG, ED: Preg Test, Ur: NEGATIVE

## 2021-05-06 LAB — LACTIC ACID, PLASMA: Lactic Acid, Venous: 1.2 mmol/L (ref 0.5–1.9)

## 2021-05-06 MED ORDER — CLINDAMYCIN HCL 150 MG PO CAPS
300.0000 mg | ORAL_CAPSULE | Freq: Once | ORAL | Status: AC
  Administered 2021-05-06: 300 mg via ORAL
  Filled 2021-05-06: qty 2

## 2021-05-06 MED ORDER — OXYCODONE-ACETAMINOPHEN 5-325 MG PO TABS
1.0000 | ORAL_TABLET | Freq: Once | ORAL | Status: AC
  Administered 2021-05-06: 1 via ORAL
  Filled 2021-05-06: qty 1

## 2021-05-06 MED ORDER — HYDROCODONE-ACETAMINOPHEN 5-325 MG PO TABS: 1.0000 | ORAL_TABLET | ORAL | 0 refills | Status: DC | PRN

## 2021-05-06 MED ORDER — CLINDAMYCIN HCL 300 MG PO CAPS: 300.0000 mg | ORAL_CAPSULE | Freq: Four times a day (QID) | ORAL | 0 refills | Status: AC

## 2021-05-06 NOTE — ED Provider Notes (Signed)
[facility] Emergency Department Provider Note  ____________________________________________  Time seen: Approximately 4:12 PM  I have reviewed the triage vital signs and the nursing notes.   HISTORY  Chief Complaint Breast Mass    HPI Peggy [name] is a 35 y.o. female who presents the emergency department complaining of right breast pain, mild edema and possible mass.  Patient states that she has felt a lesion in that breast for a while but has had rapid worsening over the past week.  There is no drainage from the nipple.  She is no longer breast-feeding.  Patient denies having imaging of this area previously.  Family history of breast cancer.  Patient has tried over-the-counter medications with no relief.  There is no fevers or chills, URI symptoms.  No chest pain.  No palpable lesions or findings in the axilla.         Past Medical History:  Diagnosis Date  . Allergies   . Anemia   . Asthma   . 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   Past Surgical History:  Procedure Laterality Date  . GASTROSCHISIS CLOSURE    . TUBAL LIGATION Bilateral 05/24/2019   Procedure: POST PARTUM TUBAL LIGATION;  Surgeon: Levie Heritage, DO;  Location: [facility] LD ORS;  Service: Gynecology;  Laterality: Bilateral;    Prior to Admission medications   Medication Sig Start Date End Date Taking? Authorizing Provider  clindamycin (CLEOCIN) 300 MG capsule Take 1 capsule (300 mg total) by mouth 4 (four) times daily for 7 days. 05/06/21 05/13/21 Yes Kade Demicco, Delorise Royals, PA-C  HYDROcodone-acetaminophen (NORCO/VICODIN) 5-325 MG tablet Take 1 tablet by mouth every 4 (four) hours as needed for severe pain. 05/06/21 05/06/22 Yes Maila Dukes, Delorise Royals, PA-C  busPIRone (BUSPAR) 10 MG tablet Take 1 tablet (10 mg total) by mouth 3 (three) times daily. 06/23/19   Anyanwu, Jethro Bastos, MD  cetirizine (ZYRTEC) 10 MG tablet TAKE 1 TABLET BY MOUTH EVERY DAY  06/19/20   Reva Bores, MD  enalapril (VASOTEC) 10 MG tablet Take 1 tablet (10 mg total) by mouth daily. 06/21/19   Anyanwu, Jethro Bastos, MD  hydrOXYzine (ATARAX/VISTARIL) 25 MG tablet Take 1-2 tablets (25-50 mg total) by mouth every 6 (six) hours as needed for anxiety. 06/23/19   Anyanwu, Jethro Bastos, MD  ibuprofen (ADVIL) 800 MG tablet TAKE 1 TABLET (800 MG TOTAL) BY MOUTH EVERY 8 (EIGHT) HOURS AS NEEDED FOR HEADACHE OR CRAMPING. 10/26/19   Anyanwu, Jethro Bastos, MD  Prenatal Multivit-Min-Fe-FA (PRENATAL VITAMINS) 0.8 MG tablet Take 1 tablet by mouth daily. 12/31/18   Leftwich-Kirby, Wilmer Floor, CNM  PROAIR HFA 108 (90 Base) MCG/ACT inhaler INHALE 1-2 PUFFS INTO THE LUNGS EVERY 6 (SIX) HOURS AS NEEDED FOR WHEEZING OR SHORTNESS OF BREATH. 03/06/20   [place] Bing, MD    Allergies Amoxicillin  Family History  Problem Relation Age of Onset  . Hypertension Mother   . Arthritis Father   . Cancer Sister     Social History Social History   Tobacco Use  . Smoking status: Current Some Day Smoker    Packs/day: 0.25    Last attempt to quit: 08/31/2018    Years since quitting: 2.6  . Smokeless tobacco: Never Used  Vaping Use  . Vaping Use: Former  Substance Use Topics  . Alcohol use: Not Currently  . Drug use: Not Currently     Review of Systems  Constitutional: No fever/chills Eyes: No  visual changes. No discharge ENT: No upper respiratory complaints. Cardiovascular: no chest pain. Respiratory: no cough. No SOB. Gastrointestinal: No abdominal pain.  No nausea, no vomiting.  No diarrhea.  No constipation. Musculoskeletal: Negative for musculoskeletal pain. Skin: Possible breast lesion/abscess/mass to the right breast Neurological: Negative for headaches, focal weakness or numbness.  10 System ROS otherwise negative.  ____________________________________________   PHYSICAL EXAM:  VITAL SIGNS: ED Triage Vitals  Enc Vitals Group     BP 05/06/21 1549 126/81     Pulse Rate 05/06/21 1549 88      Resp 05/06/21 1549 16     Temp 05/06/21 1549 98.3 F (36.8 C)     Temp Source 05/06/21 1549 Oral     SpO2 05/06/21 1549 100 %     Weight 05/06/21 1549 102 lb (46.3 kg)     Height 05/06/21 1549 4\' 11"  (1.499 m)     Head Circumference --      Peak Flow --      Pain Score 05/06/21 1559 8     Pain Loc --      Pain Edu? --      Excl. in GC? --      Constitutional: Alert and oriented. Well appearing and in no acute distress. Eyes: Conjunctivae are normal. PERRL. EOMI. Head: Atraumatic. ENT:      Ears:       Nose: No congestion/rhinnorhea.      Mouth/Throat: Mucous membranes are moist.  Neck: No stridor.    Cardiovascular: Normal rate, regular rhythm. Normal S1 and S2.  Good peripheral circulation. Respiratory: Normal respiratory effort without tachypnea or retractions. Lungs CTAB. Good air entry to the bases with no decreased or absent breath sounds. Musculoskeletal: Full range of motion to all extremities. No gross deformities appreciated. Neurologic:  Normal speech and language. No gross focal neurologic deficits are appreciated.  Skin:  Skin is warm, dry and intact. No rash noted.  Visualization of the right breast reveals mild edema in the 6 o'clock position when compared with left.  There is no significant erythema however.  Visualization of the nipples bilaterally reveal no asymmetrical findings.  There is no drainage identified.  Patient is very tender to palpation along the right inferior breast in the 6 o'clock position extending from the nipple into the breast tissue.  There is a palpable lesion that is firm to palpation and feels well-circumscribed in the breast tissue starting under the areola extending into the breast tissue.  No other palpable lesions or findings in the breast tissue.  Palpation does not elicit any drainage from the nipple itself. Psychiatric: Mood and affect are normal. Speech and behavior are normal. Patient exhibits appropriate insight and  judgement.   ____________________________________________   LABS (all labs ordered are listed, but only abnormal results are displayed)  Labs Reviewed  COMPREHENSIVE METABOLIC PANEL  LACTIC ACID, PLASMA  CBC WITH DIFFERENTIAL/PLATELET  POC URINE PREG, ED   ____________________________________________  EKG   ____________________________________________  RADIOLOGY I personally viewed and evaluated these images as part of my medical decision making, as well as reviewing the written report by the radiologist.  ED Provider Interpretation: Heterogenous collection versus solid mass identified on ultrasound measuring 1.4 x 3.2 x 3.8 cm.  ULTRASOUND OF THE RIGHT BREAST  COMPARISON: None.  FINDINGS: Ultrasound exam performed by the ultrasound technologist as no radiologist was present for this exam which was performed through the ER after hours.  Targeted ultrasound is performed, showing images over the clinical area  in question in the retroareolar region at the 6 o'clock position. Images demonstrate a heterogeneous hypoechoic area abutting the skin with increased through transmission at the 6 o'clock position of the retroareolar region. This measures approximately 1.4 x 3.2 x 3.8 cm with areas of increased vascularity. Suggestion of minimal associated adjacent skin thickening. This may represent a solid mass versus complex fluid collection/abscess.  A single image was obtained over the upper outer quadrant at the 11 o'clock position 3 cm from the nipple demonstrating normal pattern of fibroglandular tissue without focal mass or fluid collection.  IMPRESSION: Heterogeneous hypoechoic collection just below the skin at the 6 o'clock position of the right retroareolar region measuring 1.4 x 3.2 x 3.8 cm. This may represent a solid mass versus complex fluid collection/abscess.  RECOMMENDATION: Recommend further evaluation with full diagnostic evaluation including  mammographic imaging as aspiration or biopsy may be warranted.  I have discussed the findings and recommendations with the patient. If applicable, a reminder letter will be sent to the patient regarding the next appointment.  BI-RADS CATEGORY 0: Incomplete. Need additional imaging evaluation and/or prior mammograms for comparison.   Electronically Signed By: Elberta Fortis M.D. On: 05/06/2021 18:16 ____________________________________________    PROCEDURES  Procedure(s) performed:    Procedures    Medications  clindamycin (CLEOCIN) capsule 300 mg (has no administration in time range)  oxyCODONE-acetaminophen (PERCOCET/ROXICET) 5-325 MG per tablet 1 tablet (has no administration in time range)     ____________________________________________   INITIAL IMPRESSION / ASSESSMENT AND PLAN / ED COURSE  Pertinent labs & imaging results that were available during my care of the patient were reviewed by me and considered in my medical decision making (see chart for details).  Review of the [place] CSRS was performed in accordance of the [facility] prior to dispensing any controlled drugs.           Patient's diagnosis is consistent with breast lesion.  Patient presented to the emergency department complaining of a breast lesion to the right breast.  There is been increasing pain over the past week.  Evaluation revealed some mild erythema and edema of the breast tissue but no significant cellulitic changes.  Patient has a well-circumscribed palpable lesion in the 6 o'clock position starting under the areola extending into the breast tissue.  This lesion has been present for a while according to the patient.  No previous imaging of this.  At this time ultrasound revealed a lesion that could be a complex fluid collection versus solid mass.  Given the palpation I will feel any fluctuance.  Recommendations currently are to follow-up with breast center for further evaluation, and possible  aspiration versus biopsy as warranted.  I will start the patient on antibiotics though I think that this is less likely an abscess.  Limited pain medication will be prescribed for the patient.  Again follow-up with breast center.  Strict return precautions are discussed with the patient. Patient is given ED precautions to return to the ED for any worsening or new symptoms.     ____________________________________________  FINAL CLINICAL IMPRESSION(S) / ED DIAGNOSES  Final diagnoses:  Breast infection in female  Lesion of breast      NEW MEDICATIONS STARTED DURING THIS VISIT:  ED Discharge Orders         Ordered    clindamycin (CLEOCIN) 300 MG capsule  4 times daily        05/06/21 1903    HYDROcodone-acetaminophen (NORCO/VICODIN) 5-325 MG tablet  Every 4 hours PRN  05/06/21 1903              This chart was dictated using voice recognition software/Dragon. Despite best efforts to proofread, errors can occur which can change the meaning. Any change was purely unintentional.    Racheal PatchesCuthriell, Waynetta Metheny D, PA-C 05/06/21 1906    Phineas SemenGoodman, [place], MD 05/06/21 1911

## 2021-05-06 NOTE — ED Triage Notes (Signed)
Pt states for the past week she has had a lump in her right breast and states that she has some redness, swelling, and pain, states that she has been using warm compresses and states its getting worse, denies breast feeding at this time

## 2021-05-07 ENCOUNTER — Telehealth: Payer: Self-pay | Admitting: *Deleted

## 2021-05-07 NOTE — Telephone Encounter (Signed)
Transition Care Management Follow-up Telephone Call  Date of discharge and from where: 05/06/2021 [facility] ED  How have you been since you were released from the hospital? "About the same"  Any questions or concerns? No  Items Reviewed:  Did the pt receive and understand the discharge instructions provided? Yes   Medications obtained and verified? Yes   Other? No   Any new allergies since your discharge? No   Dietary orders reviewed? No  Do you have support at home? Yes    Functional Questionnaire: (I = Independent and D = Dependent) ADLs: I  Bathing/Dressing- I  Meal Prep- I  Eating- I  Maintaining continence- I  Transferring/Ambulation- I  Managing Meds- I  Follow up appointments reviewed:   PCP Hospital f/u appt confirmed? No  - [facility] at 0920.  Specialist Hospital f/u appt confirmed? No    Are transportation arrangements needed? No   If their condition worsens, is the pt aware to call PCP or go to the Emergency Dept.? Yes  Was the patient provided with contact information for the PCP's office or ED? Yes  Was to pt encouraged to call back with questions or concerns? Yes

## 2021-05-08 ENCOUNTER — Ambulatory Visit: Payer: Self-pay | Admitting: Nurse Practitioner

## 2021-05-08 ENCOUNTER — Other Ambulatory Visit: Payer: Self-pay | Admitting: Nurse Practitioner

## 2021-06-07 ENCOUNTER — Other Ambulatory Visit: Payer: Self-pay

## 2021-06-07 ENCOUNTER — Other Ambulatory Visit: Payer: Self-pay | Admitting: Nurse Practitioner

## 2021-06-07 ENCOUNTER — Encounter: Payer: Self-pay | Admitting: Nurse Practitioner

## 2021-06-07 ENCOUNTER — Ambulatory Visit: Payer: Medicaid Other | Admitting: Nurse Practitioner

## 2021-06-07 VITALS — BP 117/75 | HR 79 | Temp 97.7°F | Ht 59.0 in | Wt 108.0 lb

## 2021-06-07 DIAGNOSIS — N644 Mastodynia: Secondary | ICD-10-CM

## 2021-06-07 DIAGNOSIS — R928 Other abnormal and inconclusive findings on diagnostic imaging of breast: Secondary | ICD-10-CM | POA: Diagnosis not present

## 2021-06-07 DIAGNOSIS — Z0000 Encounter for general adult medical examination without abnormal findings: Secondary | ICD-10-CM | POA: Diagnosis not present

## 2021-06-07 DIAGNOSIS — Z7689 Persons encountering health services in other specified circumstances: Secondary | ICD-10-CM

## 2021-06-07 LAB — POCT URINALYSIS DIPSTICK
Bilirubin, UA: NEGATIVE
Blood, UA: NEGATIVE
Glucose, UA: NEGATIVE
Ketones, UA: NEGATIVE
Leukocytes, UA: NEGATIVE
Nitrite, UA: NEGATIVE
Protein, UA: NEGATIVE
Spec Grav, UA: 1.02 (ref 1.010–1.025)
Urobilinogen, UA: 0.2 E.U./dL
pH, UA: 6 (ref 5.0–8.0)

## 2021-06-07 NOTE — Patient Instructions (Signed)
Health Maintenance, Female Adopting a healthy lifestyle and getting preventive care are important in promoting health and wellness. Ask your health care provider about: The right schedule for you to have regular tests and exams. Things you can do on your own to prevent diseases and keep yourself healthy. What should I know about diet, weight, and exercise? Eat a healthy diet  Eat a diet that includes plenty of vegetables, fruits, low-fat dairy products, and lean protein. Do not eat a lot of foods that are high in solid fats, added sugars, or sodium.  Maintain a healthy weight Body mass index (BMI) is used to identify weight problems. It estimates body fat based on height and weight. Your health care provider can help determineyour BMI and help you achieve or maintain a healthy weight. Get regular exercise Get regular exercise. This is one of the most important things you can do for your health. Most adults should: Exercise for at least 150 minutes each week. The exercise should increase your heart rate and make you sweat (moderate-intensity exercise). Do strengthening exercises at least twice a week. This is in addition to the moderate-intensity exercise. Spend less time sitting. Even light physical activity can be beneficial. Watch cholesterol and blood lipids Have your blood tested for lipids and cholesterol at 35 years of age, then havethis test every 5 years. Have your cholesterol levels checked more often if: Your lipid or cholesterol levels are high. You are older than 35 years of age. You are at high risk for heart disease. What should I know about cancer screening? Depending on your health history and family history, you may need to have cancer screening at various ages. This may include screening for: Breast cancer. Cervical cancer. Colorectal cancer. Skin cancer. Lung cancer. What should I know about heart disease, diabetes, and high blood pressure? Blood pressure and heart  disease High blood pressure causes heart disease and increases the risk of stroke. This is more likely to develop in people who have high blood pressure readings, are of African descent, or are overweight. Have your blood pressure checked: Every 3-5 years if you are 18-39 years of age. Every year if you are 40 years old or older. Diabetes Have regular diabetes screenings. This checks your fasting blood sugar level. Have the screening done: Once every three years after age 40 if you are at a normal weight and have a low risk for diabetes. More often and at a younger age if you are overweight or have a high risk for diabetes. What should I know about preventing infection? Hepatitis B If you have a higher risk for hepatitis B, you should be screened for this virus. Talk with your health care provider to find out if you are at risk forhepatitis B infection. Hepatitis C Testing is recommended for: Everyone born from 1945 through 1965. Anyone with known risk factors for hepatitis C. Sexually transmitted infections (STIs) Get screened for STIs, including gonorrhea and chlamydia, if: You are sexually active and are younger than 35 years of age. You are older than 35 years of age and your health care provider tells you that you are at risk for this type of infection. Your sexual activity has changed since you were last screened, and you are at increased risk for chlamydia or gonorrhea. Ask your health care provider if you are at risk. Ask your health care provider about whether you are at high risk for HIV. Your health care provider may recommend a prescription medicine to help   prevent HIV infection. If you choose to take medicine to prevent HIV, you should first get tested for HIV. You should then be tested every 3 months for as long as you are taking the medicine. Pregnancy If you are about to stop having your period (premenopausal) and you may become pregnant, seek counseling before you get  pregnant. Take 400 to 800 micrograms (mcg) of folic acid every day if you become pregnant. Ask for birth control (contraception) if you want to prevent pregnancy. Osteoporosis and menopause Osteoporosis is a disease in which the bones lose minerals and strength with aging. This can result in bone fractures. If you are 65 years old or older, or if you are at risk for osteoporosis and fractures, ask your health care provider if you should: Be screened for bone loss. Take a calcium or vitamin D supplement to lower your risk of fractures. Be given hormone replacement therapy (HRT) to treat symptoms of menopause. Follow these instructions at home: Lifestyle Do not use any products that contain nicotine or tobacco, such as cigarettes, e-cigarettes, and chewing tobacco. If you need help quitting, ask your health care provider. Do not use street drugs. Do not share needles. Ask your health care provider for help if you need support or information about quitting drugs. Alcohol use Do not drink alcohol if: Your health care provider tells you not to drink. You are pregnant, may be pregnant, or are planning to become pregnant. If you drink alcohol: Limit how much you use to 0-1 drink a day. Limit intake if you are breastfeeding. Be aware of how much alcohol is in your drink. In the [place] one drink equals one 12 oz bottle of beer (355 mL), one 5 oz glass of wine (148 mL), or one 1 oz glass of hard liquor (44 mL). General instructions Schedule regular health, dental, and eye exams. Stay current with your vaccines. Tell your health care provider if: You often feel depressed. You have ever been abused or do not feel safe at home. Summary Adopting a healthy lifestyle and getting preventive care are important in promoting health and wellness. Follow your health care provider's instructions about healthy diet, exercising, and getting tested or screened for diseases. Follow your health care provider's  instructions on monitoring your cholesterol and blood pressure. This information is not intended to replace advice given to you by your health care provider. Make sure you discuss any questions you have with your healthcare provider. Document Revised: 12/08/2018 Document Reviewed: 12/08/2018 Elsevier Patient Education  2022 Elsevier Inc.  Breast Self-Awareness Breast self-awareness is knowing how your breasts look and feel. Doing breast self-awareness is important. It allows you to catch a breast problem early while it is still small and can be treated. All women should do breast self-awareness, including women who have had breast implants. Tell your doctorif you notice a change in your breasts. What you need: A mirror. A well-lit room. How to do a breast self-exam A breast self-exam is one way to learn what is normal for your breasts and tocheck for changes. To do a breast self-exam: Look for changes  Take off all the clothes above your waist. Stand in front of a mirror in a room with good lighting. Put your hands on your hips. Push your hands down. Look at your breasts and nipples in the mirror to see if one breast or nipple looks different from the other. Check to see if: The shape of one breast is different. The size of   one breast is different. There are wrinkles, dips, and bumps in one breast and not the other. Look at each breast for changes in the skin, such as: Redness. Scaly areas. Look for changes in your nipples, such as: Liquid around the nipples. Bleeding. Dimpling. Redness. A change in where the nipples are.  Feel for changes  Lie on your back on the floor. Feel each breast. To do this, follow these steps: Pick a breast to feel. Put the arm closest to that breast above your head. Use your other arm to feel the nipple area of your breast. Feel the area with the pads of your three middle fingers by making small circles with your fingers. For the first circle, press  lightly. For the second circle, press harder. For the third circle, press even harder. Keep making circles with your fingers at the different pressures as you move down your breast. Stop when you feel your ribs. Move your fingers a little toward the center of your body. Start making circles with your fingers again, this time going up until you reach your collarbone. Keep making up-and-down circles until you reach your armpit. Remember to keep using the three pressures. Feel the other breast in the same way. Sit or stand in the tub or shower. With soapy water on your skin, feel each breast the same way you did in step 2 when you were lying on the floor.  Write down what you find Writing down what you find can help you remember what to tell your doctor. Write down: What is normal for each breast. Any changes you find in each breast, including: The kind of changes you find. Whether you have pain. Size and location of any lumps. When you last had your menstrual period. General tips Check your breasts every month. If you are breastfeeding, the best time to check your breasts is after you feed your baby or after you use a breast pump. If you get menstrual periods, the best time to check your breasts is 5-7 days after your menstrual period is over. With time, you will become comfortable with the self-exam, and you will begin to know if there are changes in your breasts. Contact a doctor if you: See a change in the shape or size of your breasts or nipples. See a change in the skin of your breast or nipples, such as red or scaly skin. Have fluid coming from your nipples that is not normal. Find a lump or thick area that was not there before. Have pain in your breasts. Have any concerns about your breast health. Summary Breast self-awareness includes looking for changes in your breasts, as well as feeling for changes within your breasts. Breast self-awareness should be done in front of a mirror  in a well-lit room. You should check your breasts every month. If you get menstrual periods, the best time to check your breasts is 5-7 days after your menstrual period is over. Let your doctor know of any changes you see in your breasts, including changes in size, changes on the skin, pain or tenderness, or fluid from your nipples that is not normal. This information is not intended to replace advice given to you by your health care provider. Make sure you discuss any questions you have with your healthcare provider. Document Revised: 08/03/2018 Document Reviewed: 08/03/2018 Elsevier Patient Education  2022 Elsevier Inc.  

## 2021-06-07 NOTE — Progress Notes (Signed)
[facility] Patient [facility] [address] [place], [place]  [address] Phone:  [phone]   Fax:  [phone]   New Patient Office Visit  Subjective:  Patient ID: Peggy [name], female    DOB: [birth year]  Age: 35 y.o. MRN: [id number]  CC:  Chief Complaint  Patient presents with   New Patient (Initial Visit)    Was in ED for lump in right breast, [place] was done then    HPI Peggy [name] presents for to establish care. 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    She was seen in the ED for breast tenderness on her right breast. She did have an ultrasound which recommends further evaluation. She continues to have tenderness. She denies any drainage. She does complete SBE. There is a positive family history of breast cancer with her sister at the age of 60. She denies any genetic testing.  Denies headache, dizziness, visual changes, shortness of breath, dyspnea on exertion, nausea, vomiting or any edema.    Past Medical History:  Diagnosis Date   Allergies    Anemia    Asthma    History of gastroschisis 01/06/2019    Past Surgical History:  Procedure Laterality Date   GASTROSCHISIS CLOSURE     TUBAL LIGATION Bilateral 05/24/2019   Procedure: POST PARTUM TUBAL LIGATION;  Surgeon: Levie Heritage, DO;  Location: [facility] LD ORS;  Service: Gynecology;  Laterality: Bilateral;    Family History  Problem Relation Age of Onset   Hypertension Mother    Arthritis Father    Cancer Sister     Social History   Socioeconomic History   Marital status: Widowed    Spouse name: Not on file   Number of children: Not on file   Years of education: Not on file   Highest education level: Not on file  Occupational History   Not on file  Tobacco Use   Smoking status: Some Days    Packs/day: 0.25    Pack years: 0.00    Types: Cigarettes    Last attempt to quit: 08/31/2018    Years since quitting: 2.7   Smokeless tobacco: Never   Vaping Use   Vaping Use: Former  Substance and Sexual Activity   Alcohol use: Yes    Comment: occ   Drug use: Not Currently   Sexual activity: Yes    Birth control/protection: None  Other Topics Concern   Not on file  Social History Narrative   Not on file   Social Determinants of Health   Financial Resource Strain: Not on file  Food Insecurity: Not on file  Transportation Needs: Not on file  Physical Activity: Not on file  Stress: Not on file  Social Connections: Not on file  Intimate Partner Violence: Not on file    ROS Review of Systems  Objective:   Today's Vitals: BP 117/75 (BP Location: Right Arm)   Pulse 79   Temp 97.7 F (36.5 C)   Ht 4\' 11"  (1.499 m)   Wt 108 lb 0.2 oz (49 kg)   LMP 05/27/2021   SpO2 100%   Breastfeeding No   BMI 21.82 kg/m   Physical Exam Constitutional:      General: She is not in acute distress.    Appearance: She is normal weight.  HENT:     Head: Normocephalic and atraumatic.  Cardiovascular:     Rate and Rhythm: Normal rate.  Pulses: Normal pulses.     Heart sounds: Normal heart sounds.  Pulmonary:     Effort: Pulmonary effort is normal.     Breath sounds: Normal breath sounds.  Abdominal:     General: Abdomen is flat. Bowel sounds are normal. There is no distension.  Musculoskeletal:        General: Normal range of motion.     Cervical back: Normal range of motion.     Right lower leg: No edema.     Left lower leg: No edema.  Skin:    General: Skin is warm and dry.     Capillary Refill: Capillary refill takes less than 2 seconds.  Neurological:     General: No focal deficit present.     Mental Status: She is alert and oriented to person, place, and time.  Psychiatric:        Mood and Affect: Mood normal.        Behavior: Behavior normal.        Thought Content: Thought content normal.        Judgment: Judgment normal.   Assessment & Plan:   Problem List Items Addressed This Visit   None Visit Diagnoses      Breast pain, right    -  Primary Persistent diagnostic mammogram and   Relevant Orders   MM DIAG BREAST TOMO BILATERAL   Abnormal ultrasound of breast     Abnormal ultrasound mammogram suggested for further evaluation orders placed   Relevant Orders   MM DIAG BREAST TOMO BILATERAL   Health care maintenance       Relevant Orders   Urinalysis Dipstick (Completed)  Encounter to establish care Discussed female health maintenance; SBE, annual CBE, completed Discussed general safety in vehicle and COVID Discussed regular hydration with water Discussed healthy diet and exercise and weight management Discussed sexual health  Discussed mental health Encouraged to call our office for an appointment with in ongoing concerns for questions.       Outpatient Encounter Medications as of 06/07/2021  Medication Sig   cetirizine (ZYRTEC) 10 MG tablet TAKE 1 TABLET BY MOUTH EVERY DAY   HYDROcodone-acetaminophen (NORCO/VICODIN) 5-325 MG tablet Take 1 tablet by mouth every 4 (four) hours as needed for severe pain.   hydrOXYzine (ATARAX/VISTARIL) 25 MG tablet Take 1-2 tablets (25-50 mg total) by mouth every 6 (six) hours as needed for anxiety.   PROAIR HFA 108 (90 Base) MCG/ACT inhaler INHALE 1-2 PUFFS INTO THE LUNGS EVERY 6 (SIX) HOURS AS NEEDED FOR WHEEZING OR SHORTNESS OF BREATH.   busPIRone (BUSPAR) 10 MG tablet Take 1 tablet (10 mg total) by mouth 3 (three) times daily. (Patient not taking: Reported on 06/07/2021)   [DISCONTINUED] enalapril (VASOTEC) 10 MG tablet Take 1 tablet (10 mg total) by mouth daily.   [DISCONTINUED] ibuprofen (ADVIL) 800 MG tablet TAKE 1 TABLET (800 MG TOTAL) BY MOUTH EVERY 8 (EIGHT) HOURS AS NEEDED FOR HEADACHE OR CRAMPING.   [DISCONTINUED] Prenatal Multivit-Min-Fe-FA (PRENATAL VITAMINS) 0.8 MG tablet Take 1 tablet by mouth daily.   No facility-administered encounter medications on file as of 06/07/2021.    Follow-up: Return in about 6 months (around 12/07/2021) for  follow up.   Barbette Merino, NP

## 2021-06-10 ENCOUNTER — Encounter: Payer: Self-pay | Admitting: Nurse Practitioner

## 2021-06-10 ENCOUNTER — Other Ambulatory Visit: Payer: Self-pay | Admitting: Nurse Practitioner

## 2021-06-20 ENCOUNTER — Other Ambulatory Visit: Payer: Medicaid Other

## 2021-06-27 DIAGNOSIS — Z20822 Contact with and (suspected) exposure to covid-19: Secondary | ICD-10-CM | POA: Diagnosis not present

## 2021-07-29 ENCOUNTER — Ambulatory Visit
Admission: RE | Admit: 2021-07-29 | Discharge: 2021-07-29 | Disposition: A | Discharge status: Home / Self Care | Payer: Medicaid Other | Source: Ambulatory Visit | Attending: Nurse Practitioner | Admitting: Nurse Practitioner

## 2021-07-29 ENCOUNTER — Other Ambulatory Visit: Payer: Self-pay | Admitting: Nurse Practitioner

## 2021-07-29 ENCOUNTER — Other Ambulatory Visit: Payer: Self-pay

## 2021-07-29 DIAGNOSIS — R928 Other abnormal and inconclusive findings on diagnostic imaging of breast: Secondary | ICD-10-CM

## 2021-07-29 DIAGNOSIS — N644 Mastodynia: Secondary | ICD-10-CM

## 2021-07-29 DIAGNOSIS — R922 Inconclusive mammogram: Secondary | ICD-10-CM | POA: Diagnosis not present

## 2021-10-14 ENCOUNTER — Emergency Department: Payer: Medicaid Other

## 2021-10-14 ENCOUNTER — Other Ambulatory Visit: Payer: Self-pay | Admitting: Nurse Practitioner

## 2021-10-14 ENCOUNTER — Emergency Department
Admission: EM | Admit: 2021-10-14 | Discharge: 2021-10-14 | Disposition: A | Discharge status: Home / Self Care | Payer: Medicaid Other | Attending: Emergency Medicine | Admitting: Emergency Medicine

## 2021-10-14 ENCOUNTER — Encounter: Payer: Self-pay | Admitting: Emergency Medicine

## 2021-10-14 ENCOUNTER — Other Ambulatory Visit: Payer: Self-pay

## 2021-10-14 DIAGNOSIS — N6489 Other specified disorders of breast: Secondary | ICD-10-CM

## 2021-10-14 DIAGNOSIS — J45909 Unspecified asthma, uncomplicated: Secondary | ICD-10-CM | POA: Insufficient documentation

## 2021-10-14 DIAGNOSIS — F17210 Nicotine dependence, cigarettes, uncomplicated: Secondary | ICD-10-CM | POA: Diagnosis not present

## 2021-10-14 DIAGNOSIS — R928 Other abnormal and inconclusive findings on diagnostic imaging of breast: Secondary | ICD-10-CM

## 2021-10-14 DIAGNOSIS — N6320 Unspecified lump in the left breast, unspecified quadrant: Secondary | ICD-10-CM

## 2021-10-14 DIAGNOSIS — N644 Mastodynia: Secondary | ICD-10-CM | POA: Diagnosis not present

## 2021-10-14 DIAGNOSIS — L0291 Cutaneous abscess, unspecified: Secondary | ICD-10-CM

## 2021-10-14 MED ORDER — OXYCODONE-ACETAMINOPHEN 5-325 MG PO TABS
1.0000 | ORAL_TABLET | Freq: Once | ORAL | Status: AC
  Administered 2021-10-14: 1 via ORAL
  Filled 2021-10-14: qty 1

## 2021-10-14 MED ORDER — IBUPROFEN 400 MG PO TABS
400.0000 mg | ORAL_TABLET | Freq: Once | ORAL | Status: AC
  Administered 2021-10-14: 400 mg via ORAL
  Filled 2021-10-14: qty 1

## 2021-10-14 NOTE — ED Triage Notes (Signed)
C/O left breast pain and swelling since last night.  Has had similar symptoms 2 months ago and was given antibiotics.  AAOx3.  Skin warm and dry. NAD

## 2021-10-14 NOTE — Discharge Instructions (Signed)
Your ultrasound of the left breast shows a small amount of fluid and a mass in the area of your pain.  It is very important that you follow-up in the breast clinic as you will likely need a biopsy of this mass.  In the meantime you can take ibuprofen or naproxen for your symptoms.

## 2021-10-14 NOTE — ED Provider Notes (Signed)
[facility]  ____________________________________________   Event Date/Time   First MD Initiated Contact with Patient 10/14/21 1045     (approximate)  I have reviewed the triage vital signs and the nursing notes.   HISTORY  Chief Complaint Breast Pain    HPI Peggy [name] is a 35 y.o. female past medical history of asthma and anemia who presents with breast pain.  Symptoms started today around 2 AM.  Patient has had history of similar in the past.  Initially tells me that this was her left breast but then on chart review looks like it is mostly been her right breast and she does say that this is true.  She has pain around the left nipple without nipple discharge.  Has also noticed redness and swelling.  She denies fevers or chills.  She has completed courses of antibiotics in the past for this with improvement.  Tried to be seen in the breast clinic but they did not have an appointment for 3 weeks.  He is not currently breast-feeding.         Past Medical History:  Diagnosis Date   Allergies    Anemia    Asthma    History of gastroschisis 01/06/2019    There are no problems to display for this patient.   Past Surgical History:  Procedure Laterality Date   GASTROSCHISIS CLOSURE     TUBAL LIGATION Bilateral 05/24/2019   Procedure: POST PARTUM TUBAL LIGATION;  Surgeon: Levie Heritage, DO;  Location: [facility] LD ORS;  Service: Gynecology;  Laterality: Bilateral;    Prior to Admission medications   Medication Sig Start Date End Date Taking? Authorizing Provider  busPIRone (BUSPAR) 10 MG tablet Take 1 tablet (10 mg total) by mouth 3 (three) times daily. Patient not taking: Reported on 06/07/2021 06/23/19   Tereso Newcomer, MD  cetirizine (ZYRTEC) 10 MG tablet TAKE 1 TABLET BY MOUTH EVERY DAY 06/19/20   Reva Bores, MD  HYDROcodone-acetaminophen (NORCO/VICODIN) 5-325 MG tablet Take 1 tablet by mouth every 4 (four) hours as needed for severe pain. 05/06/21  05/06/22  Cuthriell, Delorise Royals, PA-C  hydrOXYzine (ATARAX/VISTARIL) 25 MG tablet Take 1-2 tablets (25-50 mg total) by mouth every 6 (six) hours as needed for anxiety. 06/23/19   Anyanwu, Jethro Bastos, MD  PROAIR HFA 108 (90 Base) MCG/ACT inhaler INHALE 1-2 PUFFS INTO THE LUNGS EVERY 6 (SIX) HOURS AS NEEDED FOR WHEEZING OR SHORTNESS OF BREATH. 03/06/20   Canby Bing, MD    Allergies Other and Amoxicillin  Family History  Problem Relation Age of Onset   Hypertension Mother    Arthritis Father    Breast cancer Sister 1   Cancer Sister     Social History Social History   Tobacco Use   Smoking status: Some Days    Packs/day: 0.25    Types: Cigarettes    Last attempt to quit: 08/31/2018    Years since quitting: 3.1   Smokeless tobacco: Never  Vaping Use   Vaping Use: Former  Substance Use Topics   Alcohol use: Yes    Comment: occ   Drug use: Not Currently    Review of Systems   Review of Systems  Constitutional:  Negative for chills and fever.  Cardiovascular:  Negative for chest pain.  Skin:  Positive for color change and rash.  All other systems reviewed and are negative.  Physical Exam Updated Vital Signs BP 114/80 (BP Location: Left Arm)   Pulse 76  Temp 98.2 F (36.8 C) (Oral)   Resp 14   Ht 4\' 11"  (1.499 m)   Wt 49 kg   LMP 10/14/2021   SpO2 97%   BMI 21.82 kg/m   Physical Exam Vitals and nursing note reviewed.  Constitutional:      General: She is not in acute distress.    Appearance: Normal appearance.  HENT:     Head: Normocephalic and atraumatic.  Eyes:     General: No scleral icterus.    Conjunctiva/sclera: Conjunctivae normal.  Pulmonary:     Effort: Pulmonary effort is normal. No respiratory distress.     Breath sounds: No stridor.  Musculoskeletal:        General: No deformity or signs of injury.     Cervical back: Normal range of motion.  Skin:    General: Skin is dry.     Coloration: Skin is not jaundiced or pale.     Comments: Mild  erythema around the left areola, no significant fluctuance but it is tender, left breast with mild tenderness diffusely, no significant swelling, no axillary lymphadenopathy  Neurological:     General: No focal deficit present.     Mental Status: She is alert and oriented to person, place, and time. Mental status is at baseline.  Psychiatric:        Mood and Affect: Mood normal.        Behavior: Behavior normal.     LABS (all labs ordered are listed, but only abnormal results are displayed)  Labs Reviewed - No data to display ____________________________________________  EKG  N/a ____________________________________________  RADIOLOGY I, 10/16/2021, personally viewed and evaluated these images (plain radiographs) as part of my medical decision making, as well as reviewing the written report by the radiologist.  ED MD interpretation: I reviewed the ultrasound the left breast which shows a small amount of fluid under the left Hermiller with a suspicious looking mass    ____________________________________________   PROCEDURES  Procedure(s) performed (including Critical Care):  Procedures   ____________________________________________   INITIAL IMPRESSION / ASSESSMENT AND PLAN / ED COURSE     Patient is a 35 year old female presents with left breast pain that started acutely last night.  No infectious symptoms.  Has had similar in the past of the bilateral breasts which seems to get better after taking antibiotics.  Record review shows that 2 months ago she had ultrasounds of the bilateral breast that showed 2 masses in the lateral right breast that were favored to be intramammary lymph nodes as well as a focal asymmetry in the lateral inferior left breast that was thought to likely be an 31 of glandular tissue.  On exam she does have some mild erythema around the left areola but no significant fluctuance.  We will obtain a left breast ultrasound to rule out  abscess.  Will definitely need outpatient follow-up.   Ultrasound is concerning for a suspicious appearing mass as well as a small amount of associated fluid, not show abscess.  Patient will need a biopsy as an outpatient.  Discussed these findings with her.     ____________________________________________   FINAL CLINICAL IMPRESSION(S) / ED DIAGNOSES  Final diagnoses:  Breast pain     ED Discharge Orders     None        Note:  This document was prepared using Dragon voice recognition software and may include unintentional dictation errors.    [place], MD 10/14/21 [phone]

## 2021-10-15 ENCOUNTER — Telehealth: Payer: Self-pay

## 2021-10-15 NOTE — Telephone Encounter (Signed)
Transition Care Management Follow-up Telephone Call Date of discharge and from where: 10/14/2021-[facility] How have you been since you were released from the hospital? Patient stated she is still in pain and waiting to have Biopsy. Patient has been advised to follow up with PCP for next step.  Any questions or concerns? No  Items Reviewed: Did the pt receive and understand the discharge instructions provided? Yes  Medications obtained and verified?  No medications given at discharge Other? No  Any new allergies since your discharge? No  Dietary orders reviewed? Yes Do you have support at home? Yes   Home Care and Equipment/Supplies: Were home health services ordered? not applicable If so, what is the name of the agency? N/A  Has the agency set up a time to come to the patient's home? not applicable Were any new equipment or medical supplies ordered?  No What is the name of the medical supply agency? N/A Were you able to get the supplies/equipment? not applicable Do you have any questions related to the use of the equipment or supplies? No  Functional Questionnaire: (I = Independent and D = Dependent) ADLs: I  Bathing/Dressing- I  Meal Prep- I  Eating- I  Maintaining continence- I  Transferring/Ambulation- I  Managing Meds- I  Follow up appointments reviewed:  PCP Hospital f/u appt confirmed? No   Specialist Hospital f/u appt confirmed? No   Are transportation arrangements needed? No  If their condition worsens, is the pt aware to call PCP or go to the Emergency Dept.? Yes Was the patient provided with contact information for the PCP's office or ED? Yes Was to pt encouraged to call back with questions or concerns? Yes

## 2021-10-16 ENCOUNTER — Other Ambulatory Visit: Payer: Self-pay | Admitting: Nurse Practitioner

## 2021-10-16 DIAGNOSIS — N6320 Unspecified lump in the left breast, unspecified quadrant: Secondary | ICD-10-CM

## 2021-10-16 DIAGNOSIS — R928 Other abnormal and inconclusive findings on diagnostic imaging of breast: Secondary | ICD-10-CM

## 2021-11-10 IMAGING — MG DIGITAL DIAGNOSTIC BILAT W/ TOMO W/ CAD
6 of 16 series · 6 of 40 positions shown · non-contrast
Comparison: None currently available
COMPARISON: None currently available

Addendum:
CLINICAL DATA: The patient presented to the emergency room May 06, 2021 with signs of infection. A right-sided mass was identified and
thought to represent either a phlegmon or a true mass. Her symptoms
on the right resolved with antibiotics. She recently developed
erythema adjacent to her left nipple with soreness of the left
nipple. The erythema has resolved but the soreness persists. The
patient presents for evaluation of the left symptoms and follow-up
of the right sonographic findings.



[L CC synth-2D (1 of 2)]
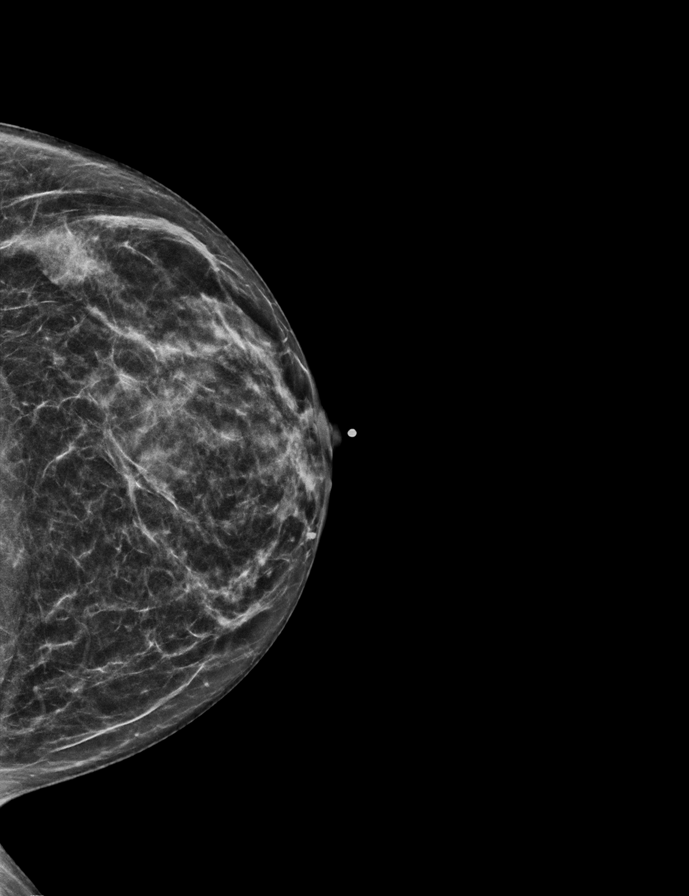

[L MLO synth-2D]
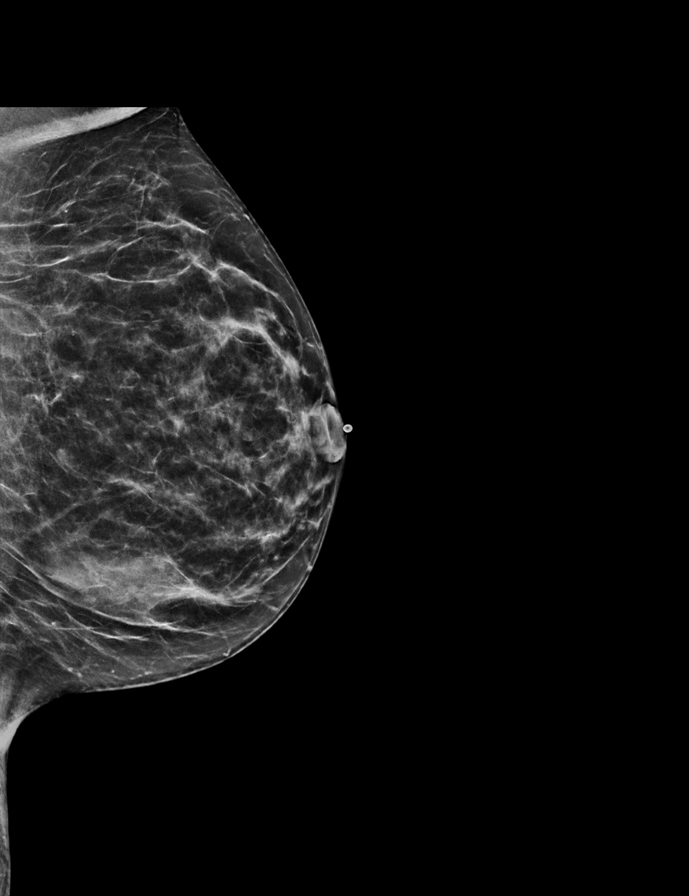

[R CC synth-2D (1 of 2)]
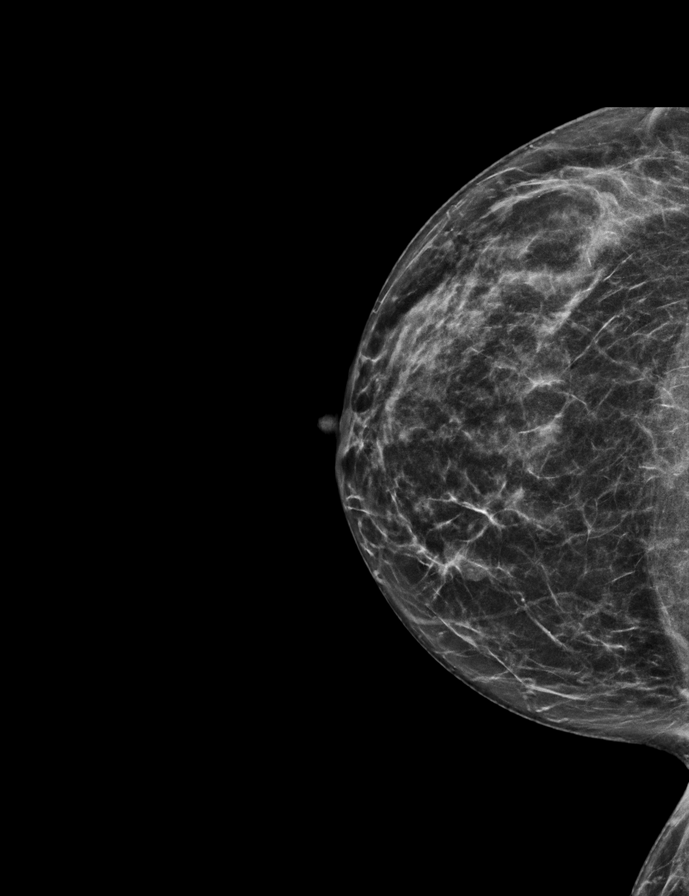

[R TAN synth-2D]
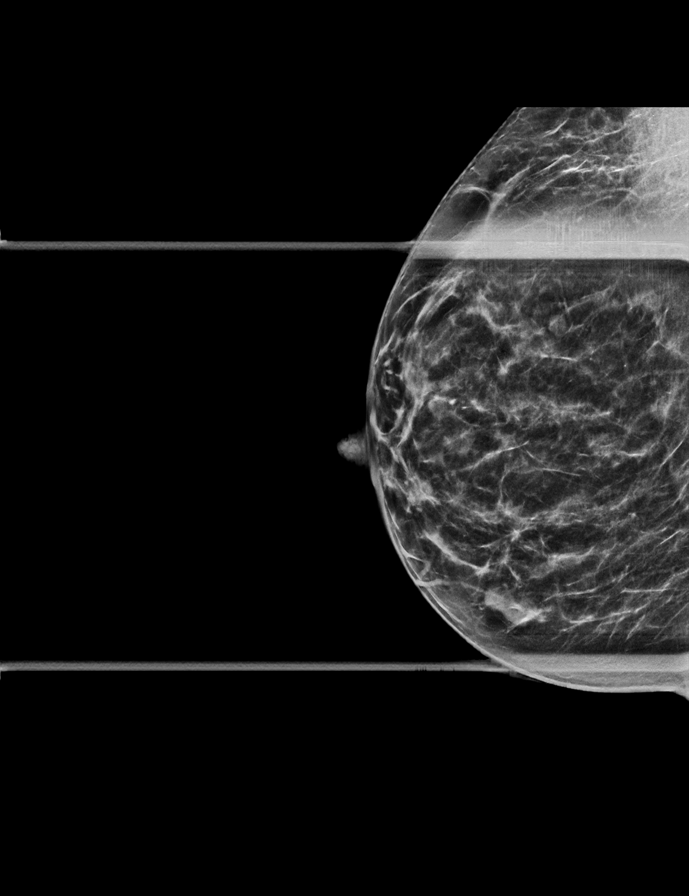

[L CC synth-2D (2 of 2)]
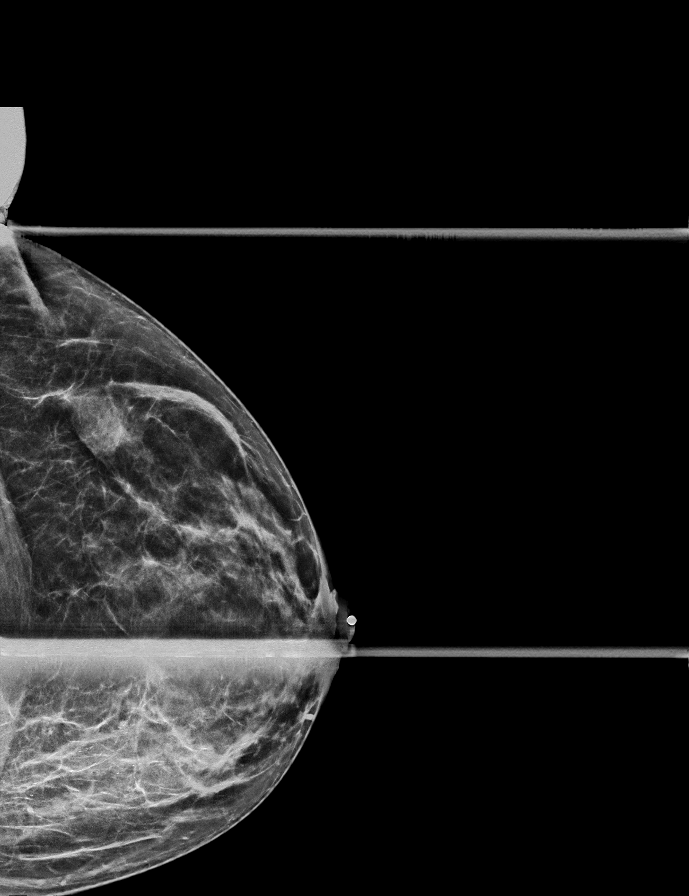

[R CC synth-2D (2 of 2)]
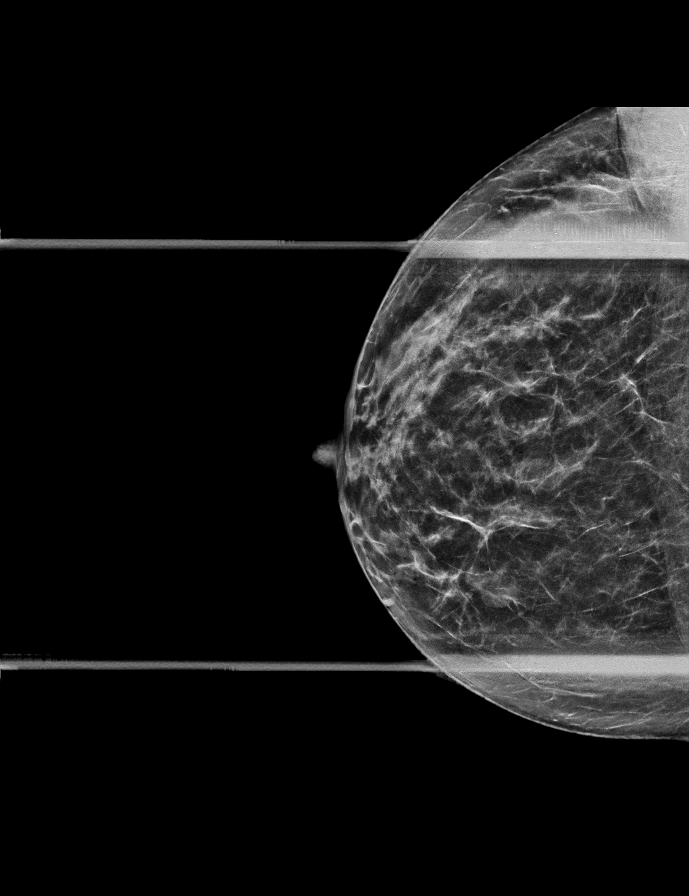

[6 of 40 positions shown; findings below may reference images not displayed]

ACR Breast Density Category b: There are scattered areas of
fibroglandular density.
FINDINGS: There are 2 masses in the lateral right breast best seen on the cc
view. An asymmetry behind the right nipple on the cc view resolves
with additional imaging. There is a mass in the inferior medial
right breast as well.

There is a focal asymmetry in the lateral inferior left breast. This
focal asymmetry appears to represent an island of dense glandular
tissue on spot images. No other suspicious mammographic findings are
identified. Specifically, no abnormalities are seen in the region of
the left nipple

On physical exam, no suspicious findings are identified.
Specifically, the left nipple is normal in appearance.

Targeted ultrasound is performed, showing no abnormalities behind
the left nipple at the site of the patient's symptoms. There is an
island of dense glandular tissue in the lower outer left breast
correlating with the focal asymmetry.

There is a mass in the right breast at 5 o'clock, 1 cm from the
nipple measuring 1.4 x 0.6 x 0.7 cm.

There is a probable intramammary lymph node in the right breast at 9
o'clock, 3 cm from the nipple correlating with 1 of the laterally
located masses identified mammographically. A second probable
intramammary lymph node at 9 o'clock, 5 cm from the nipple likely
correlates with the other laterally located mass.
IMPRESSION: No current abnormalities are seen in the region of the patient's
left nipple symptoms. The patient stated she recently had erythema
which resolved after taking antibiotics left over from her May 06, 2021 visit. She is now out of antibiotics.

The laterally located masses seen in the right breast are thought to
correlate with intramammary lymph nodes seen sonographically.

The medially located mass on the right is likely a fibroadenoma.

The focal asymmetry in the lateral inferior left breast is likely an
island of dense glandular tissue correlating with sonographic
findings.

RECOMMENDATION:
Recommend six-month follow-up mammography and ultrasound on the
right to follow the medially located right breast mass and the
probable intramammary nodes located laterally. Recommend a six-month
follow-up mammogram on the left to follow the focal asymmetry. The
patient was given a week of antibiotics, Augmentin, for her left
nipple symptoms. The left nipple symptoms should be followed to
complete clinical resolution.

I have discussed the findings and recommendations with the patient.
If applicable, a reminder letter will be sent to the patient
regarding the next appointment.

BI-RADS CATEGORY  3: Probably benign.

ADDENDUM:
Outside views are now available for comparison.

The 2 probable intramammary lymph nodes in the right breast were
probably present previously. However, the more posterior of the 2
probable intramammary nodes is difficult to see without available 3D
imaging. The mass in the medial inferior right breast appears stable
since April 09, 2018.

The focal asymmetry in the lateral inferior left breast is stable
based on the previous cc and spot views but more conspicuous
compared to the full paddle MLO view. The differences may be
technical.
IMPRESSION: The 2 masses in the lateral right breast are favored to represent
intramammary lymph nodes, probably stable but more difficult to see
previously. The medial inferior right breast mass has been stable
since 8920 not requiring follow-up. The focal asymmetry in the
lateral inferior left breast is stable compared to the cc and spot
90 degree lateral views from 8920 but more conspicuous compared to
the whole paddle MLO view in 8920. This is still favored to
represent an island of glandular tissue and the difference on the
single image from the previous study is probably technical.

Recommendation: Recommend six-month follow-up mammography and
ultrasound on the right the follow the probable intramammary lymph
nodes located laterally. Recommend six-month follow-up mammogram on
the left a follow the focal asymmetry.

BI-RADS category 3-probably benign

*** End of Addendum ***
ACR Breast Density Category b: There are scattered areas of
fibroglandular density.
FINDINGS: There are 2 masses in the lateral right breast best seen on the cc
view. An asymmetry behind the right nipple on the cc view resolves
with additional imaging. There is a mass in the inferior medial
right breast as well.

There is a focal asymmetry in the lateral inferior left breast. This
focal asymmetry appears to represent an island of dense glandular
tissue on spot images. No other suspicious mammographic findings are
identified. Specifically, no abnormalities are seen in the region of
the left nipple

On physical exam, no suspicious findings are identified.
Specifically, the left nipple is normal in appearance.

Targeted ultrasound is performed, showing no abnormalities behind
the left nipple at the site of the patient's symptoms. There is an
island of dense glandular tissue in the lower outer left breast
correlating with the focal asymmetry.

There is a mass in the right breast at 5 o'clock, 1 cm from the
nipple measuring 1.4 x 0.6 x 0.7 cm.

There is a probable intramammary lymph node in the right breast at 9
o'clock, 3 cm from the nipple correlating with 1 of the laterally
located masses identified mammographically. A second probable
intramammary lymph node at 9 o'clock, 5 cm from the nipple likely
correlates with the other laterally located mass.
IMPRESSION: No current abnormalities are seen in the region of the patient's
left nipple symptoms. The patient stated she recently had erythema
which resolved after taking antibiotics left over from her May 06, 2021 visit. She is now out of antibiotics.

The laterally located masses seen in the right breast are thought to
correlate with intramammary lymph nodes seen sonographically.

The medially located mass on the right is likely a fibroadenoma.

The focal asymmetry in the lateral inferior left breast is likely an
island of dense glandular tissue correlating with sonographic
findings.

RECOMMENDATION:
Recommend six-month follow-up mammography and ultrasound on the
right to follow the medially located right breast mass and the
probable intramammary nodes located laterally. Recommend a six-month
follow-up mammogram on the left to follow the focal asymmetry. The
patient was given a week of antibiotics, Augmentin, for her left
nipple symptoms. The left nipple symptoms should be followed to
complete clinical resolution.

I have discussed the findings and recommendations with the patient.
If applicable, a reminder letter will be sent to the patient
regarding the next appointment.

BI-RADS CATEGORY  3: Probably benign.

## 2021-12-09 ENCOUNTER — Encounter: Payer: Self-pay | Admitting: Nurse Practitioner

## 2021-12-09 ENCOUNTER — Ambulatory Visit (INDEPENDENT_AMBULATORY_CARE_PROVIDER_SITE_OTHER): Payer: Medicaid Other | Admitting: Nurse Practitioner

## 2021-12-09 ENCOUNTER — Other Ambulatory Visit: Payer: Self-pay

## 2021-12-09 VITALS — BP 137/80 | HR 82 | Temp 97.7°F | Ht 59.0 in | Wt 112.6 lb

## 2021-12-09 DIAGNOSIS — Z01419 Encounter for gynecological examination (general) (routine) without abnormal findings: Secondary | ICD-10-CM | POA: Diagnosis not present

## 2021-12-09 DIAGNOSIS — Z1239 Encounter for other screening for malignant neoplasm of breast: Secondary | ICD-10-CM | POA: Diagnosis not present

## 2021-12-09 DIAGNOSIS — R928 Other abnormal and inconclusive findings on diagnostic imaging of breast: Secondary | ICD-10-CM | POA: Diagnosis not present

## 2021-12-09 DIAGNOSIS — F4322 Adjustment disorder with anxiety: Secondary | ICD-10-CM

## 2021-12-09 DIAGNOSIS — N611 Abscess of the breast and nipple: Secondary | ICD-10-CM | POA: Diagnosis not present

## 2021-12-09 MED ORDER — HYDROXYZINE HCL 25 MG PO TABS: 25.0000 mg | ORAL_TABLET | Freq: Four times a day (QID) | ORAL | 2 refills | Status: AC | PRN

## 2021-12-09 MED ORDER — DOXYCYCLINE HYCLATE 100 MG PO CAPS: 100.0000 mg | ORAL_CAPSULE | Freq: Two times a day (BID) | ORAL | 0 refills | Status: DC

## 2021-12-09 NOTE — Progress Notes (Signed)
   [facility] Patient [facility] [address] [place], [place]  [address] Phone:  [phone]   Fax:  [phone] Subjective:     Peggy [name] is a 35 y.o. female and is here for a comprehensive physical exam. The patient reports problems - left breast pain .  Social History   Socioeconomic History   Marital status: Widowed    Spouse name: Not on file   Number of children: Not on file   Years of education: Not on file   Highest education level: Not on file  Occupational History   Not on file  Tobacco Use   Smoking status: Some Days    Packs/day: 0.25    Types: Cigarettes    Last attempt to quit: 08/31/2018    Years since quitting: 3.2   Smokeless tobacco: Never   Tobacco comments:    1 pack every 4 weeks  Vaping Use   Vaping Use: Some days   Substances: Nicotine, Flavoring  Substance and Sexual Activity   Alcohol use: Yes    Comment: occ   Drug use: Not Currently   Sexual activity: Yes    Birth control/protection: None  Other Topics Concern   Not on file  Social History Narrative   Not on file   Social Determinants of Health   Financial Resource Strain: Not on file  Food Insecurity: Not on file  Transportation Needs: Not on file  Physical Activity: Not on file  Stress: Not on file  Social Connections: Not on file  Intimate Partner Violence: Not on file   Health Maintenance  Topic Date Due   COVID-19 Vaccine (1) Never done   PAP SMEAR-Modifier  Never done   INFLUENZA VACCINE  Never done   Pneumococcal Vaccine 71-68 Years old (1 - PCV) 06/07/2022 (Originally 01/24/1992)   TETANUS/TDAP  03/02/2029   Hepatitis C Screening  Completed   HIV Screening  Completed   HPV VACCINES  Aged Out    The following portions of the patient's history were reviewed and updated as appropriate: allergies, current medications, past family history, past medical history, past social history, past surgical history, and problem list.  Review of Systems Pertinent items are  noted in HPI.   Objective:    General appearance: alert, cooperative, and appears stated age Breasts: positive findings: tender nodule located on the left upper inner quadrant Pelvic: cervix normal in appearance, external genitalia normal, no adnexal masses or tenderness, no cervical motion tenderness, rectovaginal septum normal, uterus normal size, shape, and consistency, and vagina normal with discharge    Assessment:    1. Adjustment disorder with anxiety - hydrOXYzine (ATARAX) 25 MG tablet; Take 1-2 tablets (25-50 mg total) by mouth every 6 (six) hours as needed for anxiety.  Dispense: 30 tablet; Refill: 2  2. Abnormal ultrasound of breast Biopsy order in place with [place] and diagnostic mammogram  3. Left breast abscess - doxycycline (VIBRAMYCIN) 100 MG capsule; Take 1 capsule (100 mg total) by mouth 2 (two) times daily for 7 days.  Dispense: 14 capsule; Refill: 0  4. Breast screening  5. Encounter for gynecological examination without abnormal finding    Plan:     See After Visit Summary for Counseling Recommendations

## 2021-12-09 NOTE — Patient Instructions (Signed)
Breast Self-Awareness Breast self-awareness is knowing how your breasts look and feel. Doing breast self-awareness is important. It allows you to catch a breast problem early while it is still small and can be treated. All women should do breast self-awareness, including women who have had breast implants. Tell your doctor if you notice a change in your breasts. What you need: A mirror. A well-lit room. How to do a breast self-exam A breast self-exam is one way to learn what is normal for your breasts and to check for changes. To do a breast self-exam: Look for changes  Take off all the clothes above your waist. Stand in front of a mirror in a room with good lighting. Put your hands on your hips. Push your hands down. Look at your breasts and nipples in the mirror to see if one breast or nipple looks different from the other. Check to see if: The shape of one breast is different. The size of one breast is different. There are wrinkles, dips, and bumps in one breast and not the other. Look at each breast for changes in the skin, such as: Redness. Scaly areas. Look for changes in your nipples, such as: Liquid around the nipples. Bleeding. Dimpling. Redness. A change in where the nipples are. Feel for changes  Lie on your back on the floor. Feel each breast. To do this, follow these steps: Pick a breast to feel. Put the arm closest to that breast above your head. Use your other arm to feel the nipple area of your breast. Feel the area with the pads of your three middle fingers by making small circles with your fingers. For the first circle, press lightly. For the second circle, press harder. For the third circle, press even harder. Keep making circles with your fingers at the different pressures as you move down your breast. Stop when you feel your ribs. Move your fingers a little toward the center of your body. Start making circles with your fingers again, this time going up until  you reach your collarbone. Keep making up-and-down circles until you reach your armpit. Remember to keep using the three pressures. Feel the other breast in the same way. Sit or stand in the tub or shower. With soapy water on your skin, feel each breast the same way you did in step 2 when you were lying on the floor. Write down what you find Writing down what you find can help you remember what to tell your doctor. Write down: What is normal for each breast. Any changes you find in each breast, including: The kind of changes you find. Whether you have pain. Size and location of any lumps. When you last had your menstrual period. General tips Check your breasts every month. If you are breastfeeding, the best time to check your breasts is after you feed your baby or after you use a breast pump. If you get menstrual periods, the best time to check your breasts is 5-7 days after your menstrual period is over. With time, you will become comfortable with the self-exam, and you will begin to know if there are changes in your breasts. Contact a doctor if you: See a change in the shape or size of your breasts or nipples. See a change in the skin of your breast or nipples, such as red or scaly skin. Have fluid coming from your nipples that is not normal. Find a lump or thick area that was not there before. Have pain in   your breasts. Have any concerns about your breast health. Summary Breast self-awareness includes looking for changes in your breasts, as well as feeling for changes within your breasts. Breast self-awareness should be done in front of a mirror in a well-lit room. You should check your breasts every month. If you get menstrual periods, the best time to check your breasts is 5-7 days after your menstrual period is over. Let your doctor know of any changes you see in your breasts, including changes in size, changes on the skin, pain or tenderness, or fluid from your nipples that is not  normal. This information is not intended to replace advice given to you by your health care provider. Make sure you discuss any questions you have with your health care provider. Document Revised: 08/03/2018 Document Reviewed: 08/03/2018 Elsevier Patient Education  2022 Elsevier Inc.  Pap Test Why am I having this test? A Pap test, also called a Pap smear, is a screening test to check for signs of: Infection. Cancer of the cervix. The cervix is the lower part of the uterus that opens into the vagina. Changes that may be a sign that cancer is developing (precancerous changes). Women need this test on a regular basis. In general, you should have a Pap test every 3 years until you reach menopause or age 65. Women aged 30-60 may choose to have their Pap test done at the same time as an HPV (human papillomavirus) test every 5 years (instead of every 3 years). Your health care provider may recommend having Pap tests more or less often depending on your medical conditions and past Pap test results. What is being tested? Cervical cells are tested for signs of infection or abnormalities. What kind of sample is taken? Your health care provider will collect a sample of cells from the surface of your cervix. This will be done using a small cotton swab, plastic spatula, or brush that is inserted into your vagina using a tool called a speculum. This sample is often collected during a pelvic exam, when you are lying on your back on an exam table with your feet in footrests (stirrups). In some cases, fluids (secretions) from the cervix or vagina may also be collected. How do I prepare for this test? Be aware of where you are in your menstrual cycle. If you are menstruating on the day of the test, you may be asked to reschedule. You may need to reschedule if you have a known vaginal infection on the day of the test. Follow instructions from your health care provider about: Changing or stopping your regular  medicines. Some medicines can cause abnormal test results, such as vaginal medicines and tetracycline. Avoiding douching 2-3 days before or the day of the test. Tell a health care provider about: Any allergies you have. All medicines you are taking, including vitamins, herbs, eye drops, creams, and over-the-counter medicines. Any bleeding problems you have. Any surgeries you have had. Any medical conditions you have. Whether you are pregnant or may be pregnant. How are the results reported? Your test results will be reported as either abnormal or normal. What do the results mean? A normal test result means that you do not have signs of cancer of the cervix. An abnormal result may mean that you have: Cancer. A Pap test by itself is not enough to diagnose cancer. You will have more tests done if cancer is suspected. Precancerous changes in your cervix. Inflammation of the cervix. An STI (sexually transmitted infection). A   fungal infection. A parasite infection. Talk with your health care provider about what your results mean. In some cases, your health care provider may do more testing to confirm the results. Questions to ask your health care provider Ask your health care provider, or the department that is doing the test: When will my results be ready? How will I get my results? What are my treatment options? What other tests do I need? What are my next steps? Summary In general, women should have a Pap test every 3 years until they reach menopause or age 65. Your health care provider will collect a sample of cells from the surface of your cervix. This will be done using a small cotton swab, plastic spatula, or brush. In some cases, fluids (secretions) from the cervix or vagina may also be collected. This information is not intended to replace advice given to you by your health care provider. Make sure you discuss any questions you have with your health care provider. Document  Revised: 03/15/2021 Document Reviewed: 03/15/2021 Elsevier Patient Education  2022 Elsevier Inc.  

## 2021-12-10 DIAGNOSIS — Z01419 Encounter for gynecological examination (general) (routine) without abnormal findings: Secondary | ICD-10-CM | POA: Diagnosis not present

## 2021-12-10 NOTE — Addendum Note (Signed)
Addended by: Lindley Magnus L on: 12/10/2021 09:18 AM   Modules accepted: Orders

## 2021-12-12 LAB — IGP, RFX APTIMA HPV ASCU

## 2022-01-27 ENCOUNTER — Other Ambulatory Visit: Payer: Self-pay | Admitting: Nurse Practitioner

## 2022-01-27 DIAGNOSIS — N611 Abscess of the breast and nipple: Secondary | ICD-10-CM

## 2022-01-30 ENCOUNTER — Inpatient Hospital Stay: Admission: RE | Admit: 2022-01-30 | Payer: Medicaid Other | Source: Ambulatory Visit

## 2022-01-30 ENCOUNTER — Other Ambulatory Visit: Payer: Self-pay | Admitting: Nurse Practitioner

## 2022-01-30 DIAGNOSIS — N611 Abscess of the breast and nipple: Secondary | ICD-10-CM

## 2022-01-31 ENCOUNTER — Other Ambulatory Visit: Payer: Self-pay | Admitting: Nurse Practitioner

## 2022-01-31 ENCOUNTER — Ambulatory Visit
Admission: RE | Admit: 2022-01-31 | Discharge: 2022-01-31 | Disposition: A | Discharge status: Home / Self Care | Payer: Medicaid Other | Source: Ambulatory Visit | Attending: Nurse Practitioner | Admitting: Nurse Practitioner

## 2022-01-31 DIAGNOSIS — R928 Other abnormal and inconclusive findings on diagnostic imaging of breast: Secondary | ICD-10-CM

## 2022-01-31 DIAGNOSIS — N6489 Other specified disorders of breast: Secondary | ICD-10-CM

## 2022-01-31 DIAGNOSIS — N6310 Unspecified lump in the right breast, unspecified quadrant: Secondary | ICD-10-CM

## 2022-01-31 DIAGNOSIS — N644 Mastodynia: Secondary | ICD-10-CM

## 2022-01-31 DIAGNOSIS — R922 Inconclusive mammogram: Secondary | ICD-10-CM | POA: Diagnosis not present

## 2022-04-09 ENCOUNTER — Ambulatory Visit: Payer: Self-pay | Admitting: Nurse Practitioner

## 2022-04-21 ENCOUNTER — Encounter: Payer: Self-pay | Admitting: Nurse Practitioner

## 2022-04-21 ENCOUNTER — Ambulatory Visit: Payer: Medicaid Other | Admitting: Nurse Practitioner

## 2022-04-21 VITALS — BP 128/77 | HR 83 | Temp 98.4°F | Ht 59.0 in | Wt 118.4 lb

## 2022-04-21 DIAGNOSIS — N644 Mastodynia: Secondary | ICD-10-CM | POA: Diagnosis not present

## 2022-04-21 DIAGNOSIS — Z760 Encounter for issue of repeat prescription: Secondary | ICD-10-CM | POA: Diagnosis not present

## 2022-04-21 DIAGNOSIS — N611 Abscess of the breast and nipple: Secondary | ICD-10-CM | POA: Diagnosis not present

## 2022-04-21 MED ORDER — DOXYCYCLINE HYCLATE 100 MG PO CAPS: 100.0000 mg | ORAL_CAPSULE | Freq: Two times a day (BID) | ORAL | 0 refills | Status: AC

## 2022-04-21 MED ORDER — CETIRIZINE HCL 10 MG PO TABS: 10.0000 mg | ORAL_TABLET | Freq: Every day | ORAL | 5 refills | Status: DC

## 2022-04-21 MED ORDER — ALBUTEROL SULFATE HFA 108 (90 BASE) MCG/ACT IN AERS: 1.0000 | INHALATION_SPRAY | Freq: Four times a day (QID) | RESPIRATORY_TRACT | 2 refills | Status: AC | PRN

## 2022-04-21 NOTE — Progress Notes (Signed)
? ?[place] ?[place], [place]  [address] ?Phone:  [phone]   Fax:  [phone] ?Subjective:  ? Patient ID: Peggy [name], female    DOB: 1986/04/20, 36 y.o.   MRN: [id number] ? ?Chief Complaint  ?Patient presents with  ? Follow-up  ?  Pt is here for 4 month follow up. Pt stated her left breast I still bruised and irritated. Pt is requesting a refill on zyrtec, inhaler,gabapentin,doxycycline   ? ?HPI ?Peggy [name] 36 y.o. 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 To the [facility] for four month follow up on breast pain and for medication refill. ? ?Continues to have left breast lesion with itching, pain and swelling. States that she has upcoming appointment with the breast center on 07/2022. Was informed that she needed to have [place] completed every 6 mths due to extensive family history of breast cancer.  ? ?Requesting refill of doxycycline, states that antibiotics helps with intermittent swelling of left breast. Also requesting refill of albuterol and cetrizine. Denies any other concerns today. ? ?Denies any fatigue, chest pain, shortness of breath, HA or dizziness. Denies any blurred vision, numbness or tingling. ? ? ?Past Medical History:  ?Diagnosis Date  ? Allergies   ? Anemia   ? Asthma   ? History of gastroschisis 01/06/2019  ? ? ?Past Surgical History:  ?Procedure Laterality Date  ? GASTROSCHISIS CLOSURE    ? TUBAL LIGATION Bilateral 05/24/2019  ? Procedure: POST PARTUM TUBAL LIGATION;  Surgeon: Truett Mainland, DO;  Location: [facility] LD ORS;  Service: Gynecology;  Laterality: Bilateral;  ? ? ?Family History  ?Problem Relation Age of Onset  ? Hypertension Mother   ? Arthritis Father   ? Breast cancer Sister 24  ? Cancer Sister   ? ? ?Social History  ? ?Socioeconomic History  ? Marital status: Widowed  ?  Spouse name: Not on file  ? Number of children: Not on file  ? Years of education: Not on file  ? Highest  education level: Not on file  ?Occupational History  ? Not on file  ?Tobacco Use  ? Smoking status: Some Days  ?  Packs/day: 0.25  ?  Types: Cigarettes  ?  Last attempt to quit: 08/31/2018  ?  Years since quitting: 3.6  ? Smokeless tobacco: Never  ? Tobacco comments:  ?  1 pack every 4 weeks  ?Vaping Use  ? Vaping Use: Some days  ? Substances: Nicotine, Flavoring  ?Substance and Sexual Activity  ? Alcohol use: Yes  ?  Comment: occ  ? Drug use: Not Currently  ? Sexual activity: Yes  ?  Birth control/protection: None  ?Other Topics Concern  ? Not on file  ?Social History Narrative  ? Not on file  ? ?Social Determinants of Health  ? ?Financial Resource Strain: Not on file  ?Food Insecurity: Not on file  ?Transportation Needs: Not on file  ?Physical Activity: Not on file  ?Stress: Not on file  ?Social Connections: Not on file  ?Intimate Partner Violence: Not on file  ? ? ?Outpatient Medications Prior to Visit  ?Medication Sig Dispense Refill  ? hydrOXYzine (ATARAX) 25 MG tablet Take 1-2 tablets (25-50 mg total) by mouth every 6 (six) hours as needed for anxiety. 30 tablet 2  ? cetirizine (ZYRTEC) 10 MG tablet TAKE 1 TABLET BY MOUTH EVERY DAY 30 tablet 5  ? PROAIR HFA 108 (90 Base)  MCG/ACT inhaler INHALE 1-2 PUFFS INTO THE LUNGS EVERY 6 (SIX) HOURS AS NEEDED FOR WHEEZING OR SHORTNESS OF BREATH. 8.5 g 2  ? ?No facility-administered medications prior to visit.  ? ? ?Allergies  ?Allergen Reactions  ? Other Hives and Rash  ?  SEAWEED  ? Amoxicillin Hives  ? ? ?Review of Systems  ?Constitutional:  Negative for chills, fever and malaise/fatigue.  ?Respiratory:  Negative for cough and shortness of breath.   ?Cardiovascular:  Negative for chest pain, palpitations and leg swelling.  ?Gastrointestinal:  Negative for abdominal pain, blood in stool, constipation, diarrhea, nausea and vomiting.  ?Skin:  Negative for itching and rash.  ?     See HPI  ?Neurological: Negative.   ?Psychiatric/Behavioral:  Negative for depression. The  patient is not nervous/anxious.   ?All other systems reviewed and are negative. ? ?   ?Objective:  ?  ?Physical Exam ?Chaperone present: mild tenderness with palpation of left breast at site if lesion. No swelling or discoloration noted outside of lesion.  ?Constitutional:   ?   General: She is not in acute distress. ?   Appearance: Normal appearance. She is normal weight.  ?HENT:  ?   Head: Normocephalic.  ?Cardiovascular:  ?   Rate and Rhythm: Normal rate and regular rhythm.  ?   Pulses: Normal pulses.  ?   Heart sounds: Normal heart sounds.  ?   Comments: No obvious peripheral edema ?Pulmonary:  ?   Effort: Pulmonary effort is normal.  ?   Breath sounds: Normal breath sounds.  ?Chest:  ? ? ?Skin: ?   General: Skin is warm and dry.  ?   Capillary Refill: Capillary refill takes less than 2 seconds.  ?Neurological:  ?   General: No focal deficit present.  ?   Mental Status: She is alert and oriented to person, place, and time.  ?Psychiatric:     ?   Mood and Affect: Mood normal.     ?   Behavior: Behavior normal.     ?   Thought Content: Thought content normal.     ?   Judgment: Judgment normal.  ? ? ?BP 128/77 (BP Location: Right Arm, Patient Position: Sitting, Cuff Size: Normal)   Pulse 83   Temp 98.4 ?F (36.9 ?C)   Ht 4\' 11"  (1.499 m)   Wt 118 lb 6.4 oz (53.7 kg)   SpO2 100%   BMI 23.91 kg/m?  ?Wt Readings from Last 3 Encounters:  ?04/21/22 118 lb 6.4 oz (53.7 kg)  ?12/09/21 112 lb 9.6 oz (51.1 kg)  ?10/14/21 108 lb 0.4 oz (49 kg)  ? ? ?Immunization History  ?Administered Date(s) Administered  ? Tdap 03/03/2019  ? ? ?Diabetic Foot Exam - Simple   ?No data filed ?  ? ? ?No results found for: TSH ?Lab Results  ?Component Value Date  ? WBC 8.0 05/06/2021  ? HGB 14.3 05/06/2021  ? HCT 42.2 05/06/2021  ? MCV 89.6 05/06/2021  ? PLT 248 05/06/2021  ? ?Lab Results  ?Component Value Date  ? NA 135 05/06/2021  ? K 4.1 05/06/2021  ? CO2 25 05/06/2021  ? GLUCOSE 88 05/06/2021  ? BUN 8 05/06/2021  ? CREATININE 0.72  05/06/2021  ? BILITOT 0.9 05/06/2021  ? ALKPHOS 70 05/06/2021  ? AST 20 05/06/2021  ? ALT 9 05/06/2021  ? PROT 8.1 05/06/2021  ? ALBUMIN 4.7 05/06/2021  ? CALCIUM 9.1 05/06/2021  ? ANIONGAP 8 05/06/2021  ? ?No results found for:  CHOL ?No results found for: HDL ?No results found for: [place] ?No results found for: TRIG ?No results found for: CHOLHDL ?No results found for: HGBA1C ? ?   ?Assessment & Plan:  ? ?Problem List Items Addressed This Visit   ?None ?Visit Diagnoses   ? ? Breast pain, right    -  Primary  ? Relevant Orders  ? Ambulatory referral to Dermatology ?Chart review indicates benign imaging with close follow up  ?Referral completed to specialist for further evaluation and screening  ? Medication refill      ? Relevant Medications  ? cetirizine (ZYRTEC) 10 MG tablet  ? albuterol (PROAIR HFA) 108 (90 Base) MCG/ACT inhaler  ? Left breast abscess      ? Relevant Medications  ? doxycycline (VIBRAMYCIN) 100 MG capsule  ? Other Relevant Orders  ? Ambulatory referral to Dermatology  ? ?Follow up in 6 mths for reevaluation of symptoms, sooner as needed   ? ? ?I have changed Clista Bernhardt ProAir HFA to albuterol. I have also changed her cetirizine. I am also having her maintain her hydrOXYzine and doxycycline. ? ?Meds ordered this encounter  ?Medications  ? doxycycline (VIBRAMYCIN) 100 MG capsule  ?  Sig: Take 1 capsule (100 mg total) by mouth 2 (two) times daily for 7 days.  ?  Dispense:  14 capsule  ?  Refill:  0  ? cetirizine (ZYRTEC) 10 MG tablet  ?  Sig: Take 1 tablet (10 mg total) by mouth daily.  ?  Dispense:  30 tablet  ?  Refill:  5  ? albuterol (PROAIR HFA) 108 (90 Base) MCG/ACT inhaler  ?  Sig: Inhale 1-2 puffs into the lungs every 6 (six) hours as needed for wheezing or shortness of breath.  ?  Dispense:  8.5 g  ?  Refill:  2  ? ? ? ?Teena Dunk, NP ?  ?

## 2022-04-21 NOTE — Patient Instructions (Signed)
You were seen today in the [facility] for reevaluation of breast lesion and medication refills. You were prescribed medications, please take as directed. Please follow up in 6 mths for reevaluation. Referral completed to dermatology ?

## 2022-08-21 ENCOUNTER — Other Ambulatory Visit: Payer: Medicaid Other

## 2022-10-22 ENCOUNTER — Ambulatory Visit: Payer: Self-pay | Admitting: Nurse Practitioner

## 2023-03-12 ENCOUNTER — Other Ambulatory Visit: Payer: Self-pay

## 2023-03-12 DIAGNOSIS — Z760 Encounter for issue of repeat prescription: Secondary | ICD-10-CM

## 2023-03-12 MED ORDER — CETIRIZINE HCL 10 MG PO TABS: 10.0000 mg | ORAL_TABLET | Freq: Every day | ORAL | 0 refills | Status: AC

## 2023-03-30 ENCOUNTER — Ambulatory Visit: Payer: Self-pay | Admitting: Nurse Practitioner

## 2023-10-01 ENCOUNTER — Encounter: Payer: Self-pay | Admitting: Intensive Care

## 2023-10-01 ENCOUNTER — Emergency Department
Admission: EM | Admit: 2023-10-01 | Discharge: 2023-10-02 | Disposition: A | Discharge status: Home / Self Care | Payer: Self-pay | Attending: Student in an Organized Health Care Education/Training Program | Admitting: Student in an Organized Health Care Education/Training Program

## 2023-10-01 ENCOUNTER — Other Ambulatory Visit: Payer: Self-pay

## 2023-10-01 ENCOUNTER — Emergency Department: Payer: Self-pay

## 2023-10-01 DIAGNOSIS — H532 Diplopia: Secondary | ICD-10-CM | POA: Insufficient documentation

## 2023-10-01 DIAGNOSIS — R42 Dizziness and giddiness: Secondary | ICD-10-CM | POA: Insufficient documentation

## 2023-10-01 LAB — URINALYSIS, ROUTINE W REFLEX MICROSCOPIC
Bilirubin Urine: NEGATIVE
Glucose, UA: NEGATIVE mg/dL
Hgb urine dipstick: NEGATIVE
Ketones, ur: NEGATIVE mg/dL
Leukocytes,Ua: NEGATIVE
Nitrite: NEGATIVE
Protein, ur: NEGATIVE mg/dL
Specific Gravity, Urine: 1.02 (ref 1.005–1.030)
pH: 6 (ref 5.0–8.0)

## 2023-10-01 LAB — COMPREHENSIVE METABOLIC PANEL
ALT: 9 U/L (ref 0–44)
AST: 15 U/L (ref 15–41)
Albumin: 4.3 g/dL (ref 3.5–5.0)
Alkaline Phosphatase: 64 U/L (ref 38–126)
Anion gap: 11 (ref 5–15)
BUN: 11 mg/dL (ref 6–20)
CO2: 22 mmol/L (ref 22–32)
Calcium: 8.9 mg/dL (ref 8.9–10.3)
Chloride: 103 mmol/L (ref 98–111)
Creatinine, Ser: 0.79 mg/dL (ref 0.44–1.00)
GFR, Estimated: >60 mL/min (ref >60)
Glucose, Bld: 97 mg/dL (ref 70–99)
Potassium: 3.7 mmol/L (ref 3.5–5.1)
Sodium: 136 mmol/L (ref 135–145)
Total Bilirubin: 0.6 mg/dL (ref 0.3–1.2)
Total Protein: 7.4 g/dL (ref 6.5–8.1)

## 2023-10-01 LAB — CBC WITH DIFFERENTIAL/PLATELET
Abs Immature Granulocytes: 0.03 10*3/uL (ref 0.00–0.07)
Basophils Absolute: 0 10*3/uL (ref 0.0–0.1)
Basophils Relative: 1 %
Eosinophils Absolute: 0.1 10*3/uL (ref 0.0–0.5)
Eosinophils Relative: 1 %
HCT: 38.6 % (ref 36.0–46.0)
Hemoglobin: 13.7 g/dL (ref 12.0–15.0)
Immature Granulocytes: 1 %
Lymphocytes Relative: 35 %
Lymphs Abs: 2.1 10*3/uL (ref 0.7–4.0)
MCH: 29.9 pg (ref 26.0–34.0)
MCHC: 35.5 g/dL (ref 30.0–36.0)
MCV: 84.3 fL (ref 80.0–100.0)
Monocytes Absolute: 0.6 10*3/uL (ref 0.1–1.0)
Monocytes Relative: 9 %
Neutro Abs: 3.2 10*3/uL (ref 1.7–7.7)
Neutrophils Relative %: 53 %
Platelets: 233 10*3/uL (ref 150–400)
RBC: 4.58 MIL/uL (ref 3.87–5.11)
RDW: 11.8 % (ref 11.5–15.5)
WBC: 5.9 10*3/uL (ref 4.0–10.5)
nRBC: 0 % (ref 0.0–0.2)

## 2023-10-01 MED ORDER — MECLIZINE HCL 25 MG PO TABS
25.0000 mg | ORAL_TABLET | Freq: Once | ORAL | Status: AC
  Administered 2023-10-01: 25 mg via ORAL
  Filled 2023-10-01: qty 1

## 2023-10-01 MED ORDER — GADOBUTROL 1 MMOL/ML IV SOLN
5.0000 mL | Freq: Once | INTRAVENOUS | Status: AC | PRN
  Administered 2023-10-01: 5 mL via INTRAVENOUS

## 2023-10-01 NOTE — ED Triage Notes (Addendum)
Patient c/o dizziness with nausea. Reports the room is spinning on and off. Denies vision changes. Denies injury

## 2023-10-01 NOTE — ED Provider Notes (Signed)
[facility] Provider Note    Event Date/Time   First MD Initiated Contact with Patient 10/01/23 1958     (approximate)   History   Dizziness   HPI  Peggy [name] is a 37 y.o. female   who presents to the ER for evaluation of dizziness starting this morning.  She went to bed feeling normal.  Feels like she woke up and felt the room was spinning around anytime she got up or moved or closed her eyes.  Is never felt symptoms like this before.  Denies any other associated numbness or tingling.  Is seeing double vision occasionally.  Denies any trauma.  No fevers.  No neck stiffness.      Physical Exam   Triage Vital Signs: ED Triage Vitals  Encounter Vitals Group     BP 10/01/23 1643 (!) 156/92     Systolic BP Percentile --      Diastolic BP Percentile --      Pulse Rate 10/01/23 1643 70     Resp 10/01/23 1643 16     Temp 10/01/23 1643 98.1 F (36.7 C)     Temp src --      SpO2 10/01/23 1643 100 %     Weight 10/01/23 1644 115 lb (52.2 kg)     Height 10/01/23 1644 4\' 11"  (1.499 m)     Head Circumference --      Peak Flow --      Pain Score 10/01/23 1643 0     Pain Loc --      Pain Education --      Exclude from Growth Chart --     Most recent vital signs: Vitals:   10/01/23 1643 10/01/23 2251  BP: (!) 156/92 110/83  Pulse: 70 72  Resp: 16 16  Temp: 98.1 F (36.7 C) 97.8 F (36.6 C)  SpO2: 100% 99%     Constitutional: Alert  Eyes: Conjunctivae are normal.  Head: Atraumatic. Nose: No congestion/rhinnorhea. Mouth/Throat: Mucous membranes are moist.   Neck: Painless ROM.  Cardiovascular:   Good peripheral circulation. Respiratory: Normal respiratory effort.  No retractions.  Gastrointestinal: Soft and nontender.  Musculoskeletal:  no deformity Neurologic:  MAE spontaneously.  Does have rightward nystagmus and double vision.  No facial droop.  Unable to test drift as patient was unable to close her eyes due to dizziness Skin:  Skin  is warm, dry and intact. No rash noted. Psychiatric: Mood and affect are normal. Speech and behavior are normal.    ED Results / Procedures / Treatments   Labs (all labs ordered are listed, but only abnormal results are displayed) Labs Reviewed  URINALYSIS, ROUTINE W REFLEX MICROSCOPIC - Abnormal; Notable for the following components:      Result Value   Color, Urine YELLOW (*)    APPearance CLEAR (*)    All other components within normal limits  CBC WITH DIFFERENTIAL/PLATELET  COMPREHENSIVE METABOLIC PANEL     EKG     RADIOLOGY Please see ED Course for my review and interpretation.  I personally reviewed all radiographic images ordered to evaluate for the above acute complaints and reviewed radiology reports and findings.  These findings were personally discussed with the patient.  Please see medical record for radiology report.    PROCEDURES:  Critical Care performed: No  Procedures   MEDICATIONS ORDERED IN ED: Medications  meclizine (ANTIVERT) tablet 25 mg (25 mg Oral Given 10/01/23 2045)  gadobutrol (GADAVIST) 1 MMOL/ML injection  5 mL (5 mLs Intravenous Contrast Given 10/01/23 2149)     IMPRESSION / MDM / ASSESSMENT AND PLAN / ED COURSE  I reviewed the triage vital signs and the nursing notes.                              Differential diagnosis includes, but is not limited to, MS, CVS, migraine, vertigo, BPPV, Lemierre's, dehydration, electrolyte abnormality  Patient presenting to the ER for evaluation of symptoms as described above.  Based on symptoms, risk factors and considered above differential, this presenting complaint could reflect a potentially life-threatening illness therefore the patient will be placed on continuous pulse oximetry and telemetry for monitoring.  Laboratory evaluation will be sent to evaluate for the above complaints.  Based on patient's age and presentation pathology such as and masses on my differential and as such we will order MRI  to further evaluate.  Have a lower suspicion for CVA but also in the differential.  Will treat for peripheral vertigo while awaiting testing.  Blood work is otherwise reassuring.  Doubt infectious process.   Clinical Course as of 10/01/23 2358  Thu Oct 01, 2023  2207 MRI my review interpretation does not show any sign of mass or enhancing lesions will await formal radiology report. [PR]  2341 MRI without findings suggest MS or CVA.  Most likely peripheral vertigo.  Patient stable and appropriate for outpatient follow-up. [PR]    Clinical Course User Index [PR] Willy Eddy, MD     FINAL CLINICAL IMPRESSION(S) / ED DIAGNOSES   Final diagnoses:  Vertigo     Rx / DC Orders   ED Discharge Orders     None        Note:  This document was prepared using Dragon voice recognition software and may include unintentional dictation errors.    Willy Eddy, MD 10/01/23 2358

## 2023-10-01 NOTE — ED Notes (Signed)
Pt to MRI

## 2023-10-01 NOTE — Discharge Instructions (Signed)
You can take Meclizine (Antivert) 25mg  three times daily as needed for dizziness.

## 2023-10-02 MED ORDER — MECLIZINE HCL 25 MG PO TABS: 25.0000 mg | ORAL_TABLET | Freq: Three times a day (TID) | ORAL | 0 refills | Status: AC | PRN

## 2024-08-23 NOTE — Progress Notes (Signed)
 SUBJECTIVE:  Peggy [name] is a 38 y.o. female who presents with concerns related to swelling of her left ring finger prompting the inability for her to remove the rings that are on the left ring finger.  She states that she woke up with the swelling this morning and it has stayed the same however she started getting concerned when she realized she could not get her rings off her finger.  She typically keeps her rings on at all times and sleeps with them with no issues of swelling in the past.  Based on her skin complexion obvious erythema is not noted however in comparison to her right hand and other fingers on the left hand there is a noted underlying erythema to the complexion on the left ring finger.  She does have acrylic nails on and recently had them redone 2 days ago around 48 hours prior to the finger swelling.  She denies any known insect bite to the finger.  She denies any known trauma to the finger.  She is able to move her hand and has no tenderness with extension or flexion.  Does have tenderness upon palpation but has no decreased range of motion.  Parts of patient history reviewed include PMH, problem list, medications, allergies, social history, family history, and surgical history.  OBJECTIVE: ROS:  General: Denies fever, chills, no acute distress Skin: Denies rash, reports redness ENT: Denies ear pain, rhinorrhea, sore throat Neck: Denies pain or range of motion concern Lungs: Denies shortness of breath or cough Heart: Denies chest pain, palpitations Neurologic: Denies headache or lethargy Left upper extremity: Reports left ring finger erythema, edema and inability to remove rings.  Denies limited range of motion  Vitals:   08/22/24 2151  BP: 133/77  Pulse: 75  Resp: 18  SpO2: 100%  Weight: 59 kg (130 lb)     General: Patient is well-nourished and in no acute distress.  Skin:  No rash noted.  Erythema noted to left hand ring finger with no obvious insect bite, or open  wound Head:  Normocephalic, atraumatic.   Eyes: EOM intact Neck: NROM Lungs: CTA bilaterally.  Normal effort. Heart: NRR with no murmurs, rubs, or gallops.   Neurologic: Alert and oriented Left upper extremity: Left fourth digit noted erythema and circumferential edema above ring to tip of phalange.  No noted insect bite or obvious wound.  No noted paronychia.  No decreased range of motion.  Cap refill less than 2 seconds.  No obvious tenderness to palpation over DIP or PIP joint.  Although ring is not able to go up it is able to move in a circular fashion at the base of the phalanges.  Xray: X-ray indicated no obvious fracture or dislocation   ASSESSMENT:   1. Cellulitis of finger of left hand      2. Finger joint swelling, left  XR Hand Minimum 3 Views Left   cefTRIAXone (ROCEPHIN) injection 1,000 mg   methylPREDNISolone sodium succinate (PF) (SOLU-Medrol) injection 125 mg   DISCONTINUED: methylPREDNISolone acetate (DEPO-Medrol) injection 40 mg      MDM: Peggy [name] is a 38 y.o. female who presents with concerns related to swelling of her left ring finger prompting the inability for her to remove the rings that are on the left ring finger.  She states that she woke up with the swelling this morning and it has stayed the same however she started getting concerned when she realized she could not get her rings off her finger.  She typically keeps her rings on at all times and sleeps with them with no issues of swelling in the past.  Based on her skin complexion obvious erythema is not noted however in comparison to her right hand and other fingers on the left hand there is a noted underlying erythema to the complexion on the left ring finger.  She does have acrylic nails on and recently had them redone 2 days ago around 48 hours prior to the finger swelling.  She denies any known insect bite to the finger.  She denies any known trauma to the finger.  She is able to move her hand and has no  tenderness with extension or flexion.  Does have tenderness upon palpation but has no decreased range of motion.  X-ray findings were overly negative for underlying fracture, dislocation which was encouraging to see.  Based on presentation and the fact that she recently had her acrylic nails redone the likelihood she has an underlying cellulitis that is causing the swelling to the area is high.  We did discuss removing her ring with a ring cutter however she is able to move the ring in a circular motion and does not have any paresthesia to the finger with cap refill being less than 2 seconds.  Discussed return precautions as well as signs and symptoms that would require the ring to be removed.  Discussed treating the underlying inflammation and infection with close follow-up discussed if symptoms do not improve in the next 24-48 hours.   Differentials: Cellulitis, insect bite, paronychia, joint trauma  PLAN: Discussed above findings with the likelihood that she is dealing with a cellulitis as a result of having [place] nails done approximate 24 hours before symptoms started.  Discussed treatment and medications.  Also discussed return precautions. call or return with any questions or concerns Spoke at length about changing or worsening of symptoms that should prompt going directly to the closest ER....  Patient is agreeable with assessment and plan.  Urgent Care Disposition:  Home Care  Patient's presentation is most consistent with acute complicated illness / injury requiring diagnostic workup.    Electronically signed by: Charleen Tammy Lot, NP 08/23/2024 2:30 AM

## 2024-08-24 NOTE — Progress Notes (Signed)
 Subjective:    Peggy [name] is a 38 y.o. y.o. female who presents for evaluation of a possible skin infection located on the left ring finger with swelling and unable to remove her wedding ring. Onset of symptoms was gradual starting a few days ago, with rapidly worsening course since that time. Symptoms include moderate and severe pain, erythema located distal to the wedding ring with swelling and unable to remove her wedding ring causing her pain, and swelling preventing removal of her wedding ring. Patient denies chills and fever greater than 100. There is not a history of trauma to the area. Treatment to date has included patient was given a Rocephin injection and placed on Medrol Dosepak but not improved with no relief.  Patient has no known allergies.  Review of Systems Pertinent items are noted in HPI.    Objective:    BP 121/73 (BP Location: Right arm, Patient Position: Sitting)   Pulse 73   Temp 98.4 F (36.9 C) (Oral)   Resp 20   Ht 1.499 m (4' 11)   Wt 59 kg (130 lb)   LMP 07/23/2024 (Approximate)   SpO2 100%   BMI 26.26 kg/m  General appearance: alert, appears stated age, cooperative, no distress, and worried and mildly anxious Head: Normocephalic, without obvious abnormality, atraumatic Eyes: PERRLA EOMI Extremities: edema left ring finger: 1-2+ with moderate erythema and blistered lesion but without drainage.  Left foot ring finger with wedding ring that cannot be removed. Skin: Skin color, texture, turgor normal. No rashes or lesions Lymph nodes: Cervical, supraclavicular, and axillary nodes normal. Neurologic: Grossly normal   Foreign body removal  Date/Time: 08/23/2024 11:40 PM  Performed by: Guss Aye, PA-C Authorized by: Guss Aye, PA-C   Consent:    Consent obtained:  Verbal   Consent given by:  Patient   Risks, benefits, and alternatives were discussed: yes     Risks discussed:  Bleeding, incomplete removal, infection, nerve damage, poor cosmetic result and  worsening of condition   Alternatives discussed:  Referral Universal protocol:    Procedure explained and questions answered to patient or proxy's satisfaction: yes     Relevant documents present and verified: yes     Test results available: no     Imaging studies available: yes     Required blood products, implants, devices, and special equipment available: no     Site/side marked: yes     Immediately prior to procedure, a time out was called: no     Patient identity confirmed:  Verbally with patient Location:    Location:  Finger   Finger location:  L ring finger   Depth: Superficial, wearing ring, triple band.   Tendon involvement:  None Pre-procedure details:    Imaging:  X-ray   Neurovascular status: intact   Anesthesia:    Anesthesia method:  None Procedure type:    Procedure complexity:  Complex Procedure details:    Dissection of underlying tissues: no     Bloodless field: no     Removal mechanism:  Hemostat   Foreign bodies recovered:  1   Description:  Triple banded wedding ring successfully removed by ring cutter   Intact foreign body removal: yes   Post-procedure details:    Neurovascular status: intact     Confirmation:  No additional foreign bodies on visualization   Skin closure:  None   Dressing:  Open (no dressing)   Procedure completion:  Tolerated with difficulty (Pain for procedure.  Next time consider digital  block before cutting off the ring.) Comments:     Stressed wound care to patient.     Assessment:   1. Cellulitis of ring finger of left hand  amoxicillin-pot clavulanate (AUGMENTIN) 875-125 mg per tablet   mupirocin (BACTROBAN) 2 % ointment   Foreign body removal       Plan:    Antibiotics given. Agricultural engineer distributed. Follow-up in 1 week for wound check.W/PCP  Discussed with patient infected finger with swelling preventing removal of wedding ring and causing her pain.  Ring cutter needs to cut the ring band and remove it.   Successfully removed with hemostats x 2.  Treat accordingly with Augmentin and Bactroban ointment.  Stressed over-the-counter analgesics.  Stressed ice pack for comfort.  Stressed comfort measures.  Follow-up with PCP in 1 week if not improved or sooner worsen.  Ue Thao, PA-C Wed 08/24/2024,2:21 AM

## 2024-11-07 ENCOUNTER — Emergency Department (HOSPITAL_COMMUNITY)
Admission: EM | Admit: 2024-11-07 | Discharge: 2024-11-07 | Disposition: A | Discharge status: Home / Self Care | Payer: Self-pay | Attending: Emergency Medicine | Admitting: Emergency Medicine

## 2024-11-07 ENCOUNTER — Emergency Department (HOSPITAL_COMMUNITY): Payer: Self-pay

## 2024-11-07 ENCOUNTER — Other Ambulatory Visit: Payer: Self-pay

## 2024-11-07 ENCOUNTER — Encounter (HOSPITAL_COMMUNITY): Payer: Self-pay

## 2024-11-07 DIAGNOSIS — K2900 Acute gastritis without bleeding: Secondary | ICD-10-CM | POA: Insufficient documentation

## 2024-11-07 DIAGNOSIS — R42 Dizziness and giddiness: Secondary | ICD-10-CM | POA: Insufficient documentation

## 2024-11-07 DIAGNOSIS — M791 Myalgia, unspecified site: Secondary | ICD-10-CM | POA: Insufficient documentation

## 2024-11-07 DIAGNOSIS — M7918 Myalgia, other site: Secondary | ICD-10-CM

## 2024-11-07 LAB — COMPREHENSIVE METABOLIC PANEL WITH GFR
ALT: 7 U/L (ref 0–44)
AST: 14 U/L — ABNORMAL LOW (ref 15–41)
Albumin: 3.5 g/dL (ref 3.5–5.0)
Alkaline Phosphatase: 60 U/L (ref 38–126)
Anion gap: 12 (ref 5–15)
BUN: 12 mg/dL (ref 6–20)
CO2: 19 mmol/L — ABNORMAL LOW (ref 22–32)
Calcium: 8.5 mg/dL — ABNORMAL LOW (ref 8.9–10.3)
Chloride: 107 mmol/L (ref 98–111)
Creatinine, Ser: 0.88 mg/dL (ref 0.44–1.00)
GFR, Estimated: >60 mL/min (ref >60)
Glucose, Bld: 88 mg/dL (ref 70–99)
Potassium: 3.7 mmol/L (ref 3.5–5.1)
Sodium: 138 mmol/L (ref 135–145)
Total Bilirubin: 0.7 mg/dL (ref 0.0–1.2)
Total Protein: 6.6 g/dL (ref 6.5–8.1)

## 2024-11-07 LAB — URINALYSIS, ROUTINE W REFLEX MICROSCOPIC
Bilirubin Urine: NEGATIVE
Glucose, UA: NEGATIVE mg/dL
Hgb urine dipstick: NEGATIVE
Ketones, ur: NEGATIVE mg/dL
Leukocytes,Ua: NEGATIVE
Nitrite: NEGATIVE
Protein, ur: NEGATIVE mg/dL
Specific Gravity, Urine: 1.016 (ref 1.005–1.030)
pH: 7 (ref 5.0–8.0)

## 2024-11-07 LAB — I-STAT CHEM 8, ED
BUN: 12 mg/dL (ref 6–20)
Calcium, Ion: 1.14 mmol/L — ABNORMAL LOW (ref 1.15–1.40)
Chloride: 107 mmol/L (ref 98–111)
Creatinine, Ser: 0.8 mg/dL (ref 0.44–1.00)
Glucose, Bld: 86 mg/dL (ref 70–99)
HCT: 39 % (ref 36.0–46.0)
Hemoglobin: 13.3 g/dL (ref 12.0–15.0)
Potassium: 4.1 mmol/L (ref 3.5–5.1)
Sodium: 139 mmol/L (ref 135–145)
TCO2: 21 mmol/L — ABNORMAL LOW (ref 22–32)

## 2024-11-07 LAB — CBC
HCT: 41.5 % (ref 36.0–46.0)
Hemoglobin: 14.3 g/dL (ref 12.0–15.0)
MCH: 29.9 pg (ref 26.0–34.0)
MCHC: 34.5 g/dL (ref 30.0–36.0)
MCV: 86.8 fL (ref 80.0–100.0)
Platelets: 224 K/uL (ref 150–400)
RBC: 4.78 MIL/uL (ref 3.87–5.11)
RDW: 12.2 % (ref 11.5–15.5)
WBC: 6.9 K/uL (ref 4.0–10.5)
nRBC: 0 % (ref 0.0–0.2)

## 2024-11-07 LAB — LIPASE, BLOOD: Lipase: 30 U/L (ref 11–51)

## 2024-11-07 LAB — HCG, SERUM, QUALITATIVE: Preg, Serum: NEGATIVE

## 2024-11-07 MED ORDER — ALUM & MAG HYDROXIDE-SIMETH 200-200-20 MG/5ML PO SUSP
15.0000 mL | Freq: Once | ORAL | Status: AC
  Administered 2024-11-07: 15 mL via ORAL
  Filled 2024-11-07: qty 30

## 2024-11-07 MED ORDER — CYCLOBENZAPRINE HCL 10 MG PO TABS
5.0000 mg | ORAL_TABLET | Freq: Once | ORAL | Status: AC
  Administered 2024-11-07: 5 mg via ORAL
  Filled 2024-11-07: qty 1

## 2024-11-07 MED ORDER — ONDANSETRON 4 MG PO TBDP
4.0000 mg | ORAL_TABLET | Freq: Once | ORAL | Status: AC | PRN
  Administered 2024-11-07: 4 mg via ORAL
  Filled 2024-11-07: qty 1

## 2024-11-07 MED ORDER — KETOROLAC TROMETHAMINE 15 MG/ML IJ SOLN
15.0000 mg | Freq: Once | INTRAMUSCULAR | Status: AC
  Administered 2024-11-07: 15 mg via INTRAVENOUS
  Filled 2024-11-07: qty 1

## 2024-11-07 MED ORDER — MORPHINE SULFATE (PF) 4 MG/ML IV SOLN
4.0000 mg | Freq: Once | INTRAVENOUS | Status: AC
  Administered 2024-11-07: 4 mg via INTRAVENOUS
  Filled 2024-11-07: qty 1

## 2024-11-07 MED ORDER — CYCLOBENZAPRINE HCL 5 MG PO TABS: 5.0000 mg | ORAL_TABLET | Freq: Three times a day (TID) | ORAL | 0 refills | Status: AC | PRN

## 2024-11-07 MED ORDER — SUCRALFATE 1 G PO TABS
1.0000 g | ORAL_TABLET | Freq: Three times a day (TID) | ORAL | 0 refills | Status: AC
Start: 2024-11-07 — End: ?

## 2024-11-07 MED ORDER — IOHEXOL 350 MG/ML SOLN
75.0000 mL | Freq: Once | INTRAVENOUS | Status: AC | PRN
  Administered 2024-11-07: 75 mL via INTRAVENOUS

## 2024-11-07 MED ORDER — SUCRALFATE 1 G PO TABS
1.0000 g | ORAL_TABLET | Freq: Once | ORAL | Status: AC
  Administered 2024-11-07: 1 g via ORAL
  Filled 2024-11-07: qty 1

## 2024-11-07 MED ORDER — SODIUM CHLORIDE 0.9 % IV BOLUS
1000.0000 mL | Freq: Once | INTRAVENOUS | Status: AC
  Administered 2024-11-07: 1000 mL via INTRAVENOUS

## 2024-11-07 MED ORDER — PANTOPRAZOLE SODIUM 20 MG PO TBEC: 20.0000 mg | DELAYED_RELEASE_TABLET | Freq: Every day | ORAL | 0 refills | Status: DC

## 2024-11-07 NOTE — Discharge Instructions (Signed)
 You were seen today for abdominal pain. While you were here we monitored your vitals, performed a physical exam, blood work and imaging. These were all reassuring and there is no indication for any further testing or intervention in the emergency department at this time.  Your pain is likely caused by gastritis or indigestion  Things to do:  - Follow up with your primary care provider within the next 1-2 weeks - A referral has been placed to follow-up with GI - Take Protonix daily - Take Carafate with meals and at bedtime - Take Flexeril 3 times a day as needed for rib pain, do not drive or operate heavy machinery while on this medication  Return to the emergency department if you have any new or worsening symptoms including inability to tolerate fluids, bloody vomit, bloody stool, chest pain, passing out, or if you have any other concerns.

## 2024-11-07 NOTE — ED Provider Notes (Signed)
 [place] EMERGENCY DEPARTMENT AT [facility] Provider Note   CSN: [id number] Arrival date & time: 11/07/24  9280     Patient presents with: No chief complaint on file.   Peggy [name] is a 38 y.o. 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  Patient reports that she has had intermittent right sided abdominal pain for about a week that has become significantly worsened since Friday.  She reports this pain is worse with eating and she endorses  associated nausea and vomiting.  Patient also reports lightheadedness which she experiences with positional changes.  She denies any dizziness at rest, numbness, tingling, headache, or weakness.  Patient also reports she has had decreased oral intake in the setting of her pain.  Patient denies dysuria, hematuria, or constipation.  She endorses loose stools since Friday.  {Add pertinent medical, surgical, social history, OB history to HPI:32947} HPI     Prior to Admission medications   Medication Sig Start Date End Date Taking? Authorizing Provider  albuterol  (PROAIR  HFA) 108 (90 Base) MCG/ACT inhaler Inhale 1-2 puffs into the lungs every 6 (six) hours as needed for wheezing or shortness of breath. 04/21/22   Passmore, Christain I, NP  cetirizine  (ZYRTEC ) 10 MG tablet Take 1 tablet (10 mg total) by mouth daily. 03/12/23   Oley Bascom RAMAN, NP  meclizine  (ANTIVERT ) 25 MG tablet Take 1 tablet (25 mg total) by mouth 3 (three) times daily as needed for dizziness. 10/02/23   Cyrena Mylar, MD    Allergies: Other    Review of Systems  Updated Vital Signs BP 120/80 (BP Location: Right Arm)   Pulse 73   Temp 97.7 F (36.5 C)   Resp 17   Ht 4' 11 (1.499 m)   Wt 59 kg   LMP 10/02/2024 (Approximate)   SpO2 98%   BMI 26.26 kg/m   Physical Exam Vitals and nursing note reviewed.  Constitutional:       General: She is not in acute distress.    Appearance: She is well-developed.  HENT:     Head: Normocephalic and atraumatic.  Eyes:     Conjunctiva/sclera: Conjunctivae normal.  Cardiovascular:     Rate and Rhythm: Normal rate and regular rhythm.     Heart sounds: No murmur heard. Pulmonary:     Effort: Pulmonary effort is normal. No respiratory distress.     Breath sounds: Normal breath sounds.  Abdominal:     Palpations: Abdomen is soft.     Tenderness: There is abdominal tenderness (Right upper quadrant and epigastric). There is right CVA tenderness. There is no left CVA tenderness.     Comments: Well-healed horizontal surgical scar in left upper quadrant  Musculoskeletal:     Cervical back: Neck supple.  Skin:    General: Skin is warm and dry.     Capillary Refill: Capillary refill takes less than 2 seconds.  Neurological:     General: No focal deficit present.     Mental Status: She is alert and oriented to person, place, and time. Mental status is at baseline.     Cranial Nerves: No cranial nerve deficit.     Sensory: No sensory deficit.     Motor: No weakness.     (all labs ordered are listed, but only abnormal results are displayed) Labs Reviewed  LIPASE, BLOOD  COMPREHENSIVE METABOLIC PANEL WITH GFR  CBC  URINALYSIS, ROUTINE W REFLEX MICROSCOPIC  HCG, SERUM, QUALITATIVE    EKG: None  Radiology: No results found.  {Document cardiac monitor, telemetry assessment procedure when appropriate:32947} Procedures   Medications Ordered in the ED  ondansetron  (ZOFRAN -ODT) disintegrating tablet 4 mg (4 mg Oral Given 11/07/24 0733)    Clinical Course as of 11/07/24 0832  Mon Nov 07, 2024  0814 ED EKG Normal sinus rhythm, no evidence of acute ischemia or electrical abnormality [LB]    Clinical Course User Index [LB] Sharlet Dowdy, MD   {Click here for ABCD2, HEART and other calculators REFRESH Note before signing:1}                              Medical Decision  Making Amount and/or Complexity of Data Reviewed Labs: ordered. Radiology: ordered. ECG/medicine tests:  Decision-making details documented in ED Course.  Risk Prescription drug management.   ***  {Document critical care time when appropriate  Document review of labs and clinical decision tools ie CHADS2VASC2, etc  Document your independent review of radiology images and any outside records  Document your discussion with family members, caretakers and with consultants  Document social determinants of health affecting pt's care  Document your decision making why or why not admission, treatments were needed:32947:::1}   Final diagnoses:  None    ED Discharge Orders     None

## 2024-11-07 NOTE — ED Provider Notes (Signed)
 Supervised resident visit.  Patient here right-sided abdominal pain.  Will get labs CT scan abdomen pelvis.  Could be pancreatitis versus cholecystitis versus bowel obstruction versus kidney stone versus uterine process.  Will give pain management.  Vital signs are reassuring.  Please see my resident note for further results evaluation and disposition of the patient.  This chart was dictated using voice recognition software.  Despite best efforts to proofread,  errors can occur which can change the documentation meaning.    Ruthe Cornet, DO 11/07/24 1205

## 2024-11-07 NOTE — ED Notes (Signed)
Pt ambulated to RR .  

## 2024-11-07 NOTE — ED Triage Notes (Signed)
 Pt presents for 8/10 R. Side abd pain x 3 days with dizziness.  Pt endorses nausea.

## 2024-12-26 ENCOUNTER — Ambulatory Visit: Payer: Self-pay | Admitting: Gastroenterology

## 2024-12-26 ENCOUNTER — Encounter: Payer: Self-pay | Admitting: Gastroenterology

## 2024-12-26 VITALS — BP 108/68 | HR 74 | Ht 59.0 in | Wt 133.0 lb

## 2024-12-26 DIAGNOSIS — R1011 Right upper quadrant pain: Secondary | ICD-10-CM | POA: Insufficient documentation

## 2024-12-26 DIAGNOSIS — K5909 Other constipation: Secondary | ICD-10-CM

## 2024-12-26 DIAGNOSIS — K219 Gastro-esophageal reflux disease without esophagitis: Secondary | ICD-10-CM | POA: Insufficient documentation

## 2024-12-26 DIAGNOSIS — K760 Fatty (change of) liver, not elsewhere classified: Secondary | ICD-10-CM

## 2024-12-26 MED ORDER — PANTOPRAZOLE SODIUM 40 MG PO TBEC: 40.0000 mg | DELAYED_RELEASE_TABLET | Freq: Every day | ORAL | 3 refills | Status: AC

## 2024-12-26 NOTE — Patient Instructions (Signed)
 We have sent the following medications to your pharmacy for you to pick up at your convenience: Pantoprazole  40 mg daily 30-60 minutes before breakfast.   Start Miralax 1/2 capful daily in 8 ounces of liquid and increase if needed.   You have been scheduled for a HIDA scan at [facility] (1st floor) on Friday 01/13/25. Please arrive 30 minutes prior to your scheduled appointment at  8 am. Make certain not to have anything to eat or drink at least 6 hours prior to your test. Should this appointment date or time not work well for you, please call radiology scheduling at [phone].  _____________________________________________________________________ hepatobiliary (HIDA) scan is an imaging procedure used to diagnose problems in the liver, gallbladder and bile ducts. In the HIDA scan, a radioactive chemical or tracer is injected into a vein in your arm. The tracer is handled by the liver like bile. Bile is a fluid produced and excreted by your liver that helps your digestive system break down fats in the foods you eat. Bile is stored in your gallbladder and the gallbladder releases the bile when you eat a meal. A special nuclear medicine scanner (gamma camera) tracks the flow of the tracer from your liver into your gallbladder and small intestine.  During your HIDA scan  You'll be asked to change into a hospital gown before your HIDA scan begins. Your health care team will position you on a table, usually on your back. The radioactive tracer is then injected into a vein in your arm.The tracer travels through your bloodstream to your liver, where it's taken up by the bile-producing cells. The radioactive tracer travels with the bile from your liver into your gallbladder and through your bile ducts to your small intestine.You may feel some pressure while the radioactive tracer is injected into your vein. As you lie on the table, a special gamma camera is positioned over your abdomen taking pictures  of the tracer as it moves through your body. The gamma camera takes pictures continually for about an hour. You'll need to keep still during the HIDA scan. This can become uncomfortable, but you may find that you can lessen the discomfort by taking deep breaths and thinking about other things. Tell your health care team if you're uncomfortable. The radiologist will watch on a computer the progress of the radioactive tracer through your body. The HIDA scan may be stopped when the radioactive tracer is seen in the gallbladder and enters your small intestine. This typically takes about an hour. In some cases extra imaging will be performed if original images aren't satisfactory, if morphine  is given to help visualize the gallbladder or if the medication CCK is given to look at the contraction of the gallbladder. This test typically takes 2 hours to complete. ________________________________________________________________________

## 2024-12-26 NOTE — Progress Notes (Signed)
 "    12/26/2024 Peggy [name] [id number] 01/18/1986   Discussed the use of AI scribe software for clinical note transcription with the patient, who gave verbal consent to proceed.  History of Present Illness Peggy [name] is a 38 year old female with a history of congenital abdominal wall surgery who presents for evaluation of chronic right-sided abdominal pain and GERD.  Chronic right-sided abdominal pain has been present intermittently for approximately one to one and a half years, with episodes lasting up to a week when present. Pain is sometimes severe, described as excruciating and leaving her without breath and unable to function, occasionally causing her to drop. Pain is consistently right-sided, triggered by certain movements such as leaning or turning, and is associated with nausea but not vomiting. Not necessarily correlation with eating. She manages pain with ibuprofen  every other day, one to two times daily, and rotates with Tylenol . Gabapentin is taken at night for pain control.  Emergency room evaluation included CT, ultrasound, and blood work. CT showed possible extrahepatic bile duct dilation but no intrahepatic duct dilation with a normal liver. Ultrasound revealed no gallstones, normal bile ducts, and a component of fatty liver. Liver tests were normal.  New onset reflux symptoms began in the past month to month and a half, described as reflux and all new to me. Feels burning in her throat.  She was started on pantoprazole  20 mg daily and Carafate  tablet after her ER visit. Carafate  causes fatigue, limiting daytime use. No improvement in reflux symptoms since starting these medications. No prior use of reflux medications before the ER visit.  Chronic constipation is present, with bowel movements typically once per week, which she considers baseline and has been that way for years. No straining or difficulty with bowel movements. Diet includes water, fruits, vegetables,  and salads when able. No prior use of medications for constipation.   Fib 4 score 0.90.  Lab Results  Component Value Date   ALT 7 11/07/2024   AST 14 (L) 11/07/2024   ALKPHOS 60 11/07/2024   BILITOT 0.7 11/07/2024   Results Labs Liver enzymes (10/2024): Within normal limits Platelet count (10/2024): Within normal limits Fib-4 score (10/2024): 0.90  Radiology Abdominal CT (10/2024): Normal hepatic parenchyma. Possible biliary ductal dilation. Abdominal ultrasound (10/2024): No cholelithiasis. Normal biliary ducts. Hepatic steatosis.   Past Medical History:  Diagnosis Date   Allergies    Anemia    Asthma    History of gastroschisis 01/06/2019   Past Surgical History:  Procedure Laterality Date   GASTROSCHISIS CLOSURE     TUBAL LIGATION Bilateral 05/24/2019   Procedure: POST PARTUM TUBAL LIGATION;  Surgeon: Barbra Lang PARAS, DO;  Location: [facility] ORS;  Service: Gynecology;  Laterality: Bilateral;    reports that she has been smoking cigarettes. She has been smoking an average of 0.3 packs per day. She has never used smokeless tobacco. She reports that she does not currently use alcohol. She reports that she does not currently use drugs. family history includes Arthritis in her father; Breast cancer (age of onset: 71) in her sister; Cancer in her sister; Hypertension in her mother. Allergies[1]    Outpatient Encounter Medications as of 12/26/2024  Medication Sig   albuterol  (PROAIR  HFA) 108 (90 Base) MCG/ACT inhaler Inhale 1-2 puffs into the lungs every 6 (six) hours as needed for wheezing or shortness of breath.   cetirizine  (ZYRTEC ) 10 MG tablet Take 1 tablet (10 mg total) by mouth daily.   gabapentin (NEURONTIN)  300 MG capsule Take 300 mg by mouth daily.   meclizine  (ANTIVERT ) 25 MG tablet Take 1 tablet (25 mg total) by mouth 3 (three) times daily as needed for dizziness.   pantoprazole  (PROTONIX ) 20 MG tablet Take 1 tablet (20 mg total) by mouth daily.   sucralfate   (CARAFATE ) 1 g tablet Take 1 tablet (1 g total) by mouth 4 (four) times daily -  with meals and at bedtime.   No facility-administered encounter medications on file as of 12/26/2024.     REVIEW OF SYSTEMS  : All other systems reviewed and negative except where noted in the History of Present Illness.   PHYSICAL EXAM: BP 108/68   Pulse 74   Ht 4' 11 (1.499 m)   Wt 133 lb (60.3 kg)   LMP 12/11/2024   SpO2 96%   BMI 26.86 kg/m  General: Well developed female in no acute distress Head: Normocephalic and atraumatic Eyes:  Sclerae anicteric, conjunctiva pink. Ears: Normal auditory acuity Lungs: Clear throughout to auscultation; no W/R/R. Heart: Regular rate and rhythm; no M/R/G. Abdomen: Soft, non-distended.  BS present.  Mild diffuse TTP > in the RUQ.  Scar noted across upper abdomen from previous surgery. Musculoskeletal: Symmetrical with no gross deformities  Skin: No lesions on visible extremities Extremities: No edema  Neurological: Alert oriented x 4, grossly non-focal Psychological:  Alert and cooperative. Normal mood and affect  Assessment & Plan Right upper quadrant abdominal pain and gastroesophageal reflux Chronic, intermittent, and sometimes severe right upper quadrant pain with new-onset gastroesophageal reflux, refractory to initial/low dose acid suppression and sucralfate . Differential includes peptic ulcer disease, gallbladder dysfunction, and pain related to prior abdominal surgery (adhesions or nerve injury). Ongoing NSAID use increases risk for peptic ulcer disease (takes NSAIDs for the RUQ abdominal pain). - Ordered HIDA scan with CCK to evaluate for gallbladder dysfunction. - Scheduled upper endoscopy to assess for peptic ulcer disease and further evaluate reflux symptoms.  Scheduled with Dr. Legrand.  The risks, benefits, and alternatives to EGD were discussed with the patient and she consents to proceed. - Increased pantoprazole  to 40 mg once daily.  New  prescription sent to pharmacy. - Advised to continue sucralfate  if available until course completed.  Nonalcoholic fatty liver disease Mild hepatic steatosis on imaging with normal transaminases and low fib-4 score, indicating no significant hepatic inflammation or fibrosis and not contributing to current symptoms. - Advised lifestyle modifications including healthy diet and regular exercise.  Chronic constipation Longstanding constipation with baseline of one bowel movement per week (moves without difficulty), without recent change or alarm features. Increased frequency to at least two per week is recommended. - Recommended increased water intake. - Encouraged increased dietary fiber through fruits, vegetables, and salads. - Recommended starting polyethylene glycol (Miralax) at half a capful daily, titrating as needed to achieve at least two bowel movements per week.  CC:  Curatolo, Adam, DO       [1]  Allergies Allergen Reactions   Other Hives and Rash    [facility]   "

## 2025-01-06 NOTE — Progress Notes (Signed)
 ____________________________________________________________  Attending physician addendum:  Thank you for sending this case to me. I have reviewed the entire note and agree with the plan.  Workup as described is entirely reasonable.  Pain is described sounds possibly musculoskeletal/abdominal wall pain if it is worse with certain movements.  Victory Brand, MD  ____________________________________________________________

## 2025-01-13 ENCOUNTER — Ambulatory Visit (HOSPITAL_COMMUNITY)
Admission: RE | Admit: 2025-01-13 | Discharge: 2025-01-13 | Disposition: A | Discharge status: Home / Self Care | Source: Ambulatory Visit | Attending: Gastroenterology | Admitting: Gastroenterology

## 2025-01-13 DIAGNOSIS — K219 Gastro-esophageal reflux disease without esophagitis: Secondary | ICD-10-CM | POA: Insufficient documentation

## 2025-01-13 DIAGNOSIS — R1011 Right upper quadrant pain: Secondary | ICD-10-CM | POA: Diagnosis present

## 2025-01-13 MED ORDER — TECHNETIUM TC 99M MEBROFENIN IV KIT
5.1000 | PACK | Freq: Once | INTRAVENOUS | Status: AC
  Administered 2025-01-13: 5.1 via INTRAVENOUS

## 2025-01-18 ENCOUNTER — Ambulatory Visit: Payer: Self-pay | Admitting: Gastroenterology

## 2025-01-20 ENCOUNTER — Encounter: Payer: Self-pay | Admitting: Gastroenterology

## 2025-01-27 ENCOUNTER — Encounter: Payer: Self-pay | Admitting: Gastroenterology

## 2025-01-27 ENCOUNTER — Ambulatory Visit: Admitting: Gastroenterology

## 2025-01-27 VITALS — BP 123/81 | HR 58 | Temp 97.2°F | Resp 13 | Ht 59.0 in | Wt 133.0 lb

## 2025-01-27 DIAGNOSIS — R1011 Right upper quadrant pain: Secondary | ICD-10-CM | POA: Diagnosis present

## 2025-01-27 DIAGNOSIS — R12 Heartburn: Secondary | ICD-10-CM | POA: Diagnosis not present

## 2025-01-27 MED ORDER — SODIUM CHLORIDE 0.9 % IV SOLN: 500.0000 mL | Freq: Once | INTRAVENOUS | Status: AC

## 2025-01-27 NOTE — Progress Notes (Unsigned)
 Pt's states no medical or surgical changes since previsit or office visit.

## 2025-01-27 NOTE — Patient Instructions (Signed)
 Your procedure was normal. Read your discharge instructions.  YOU HAD AN ENDOSCOPIC PROCEDURE TODAY AT THE [facility]:   Refer to the procedure report that was given to you for any specific questions about what was found during the examination.  If the procedure report does not answer your questions, please call your gastroenterologist to clarify.  If you requested that your care partner not be given the details of your procedure findings, then the procedure report has been included in a sealed envelope for you to review at your convenience later.  YOU SHOULD EXPECT: Some feelings of bloating in the abdomen. Passage of more gas than usual.  Walking can help get rid of the air that was put into your GI tract during the procedure and reduce the bloating.   Please Note:  You might notice some irritation and congestion in your nose or some drainage.  This is from the oxygen used during your procedure.  There is no need for concern and it should clear up in a day or so.  SYMPTOMS TO REPORT IMMEDIATELY:  Following upper endoscopy (EGD)  Vomiting of blood or coffee ground material  New chest pain or pain under the shoulder blades  Painful or persistently difficult swallowing  New shortness of breath  Fever of 100F or higher  Black, tarry-looking stools  For urgent or emergent issues, a gastroenterologist can be reached at any hour by calling [phone]. Do not use MyChart messaging for urgent concerns.    DIET:  We do recommend a small meal at first, but then you may proceed to your regular diet.  Drink plenty of fluids but you should avoid alcoholic beverages for 24 hours.  ACTIVITY:  You should plan to take it easy for the rest of today and you should NOT DRIVE or use heavy machinery until tomorrow (because of the sedation medicines used during the test).    FOLLOW UP: Our staff will call the number listed on your records the next business day following your procedure.  We  will call around 7:15- 8:00 am to check on you and address any questions or concerns that you may have regarding the information given to you following your procedure. If we do not reach you, we will leave a message.      SIGNATURES/CONFIDENTIALITY: You and/or your care partner have signed paperwork which will be entered into your electronic medical record.  These signatures attest to the fact that that the information above on your After Visit Summary has been reviewed and is understood.  Full responsibility of the confidentiality of this discharge information lies with you and/or your care-partner.

## 2025-01-27 NOTE — Op Note (Signed)
 [facility] Patient Name: Peggy [name] Procedure Date: 01/27/2025 2:17 PM MRN: [id number] Endoscopist: Victory L. Legrand , MD, [id number] Age: 39 Referring MD:  Date of Birth: 06/13/1986 Gender: Female Account #: [id number] Procedure:                Upper GI endoscopy Indications:              Abdominal pain in the right upper quadrant                           episodic, sharp, occurs with certain movements                           nml RUQ US  and CCKHIDA Medicines:                Monitored Anesthesia Care Procedure:                Pre-Anesthesia Assessment:                           - Prior to the procedure, a History and Physical                            was performed, and patient medications and                            allergies were reviewed. The patient's tolerance of                            previous anesthesia was also reviewed. The risks                            and benefits of the procedure and the sedation                            options and risks were discussed with the patient.                            All questions were answered, and informed consent                            was obtained. Prior Anticoagulants: The patient has                            taken no anticoagulant or antiplatelet agents. ASA                            Grade Assessment: II - A patient with mild systemic                            disease. After reviewing the risks and benefits,                            the patient was deemed in satisfactory condition to  undergo the procedure.                           After obtaining informed consent, the endoscope was                            passed under direct vision. Throughout the                            procedure, the patient's blood pressure, pulse, and                            oxygen saturations were monitored continuously. The                            Olympus Scope [id number] was introduced  through the                            mouth, and advanced to the second part of duodenum.                            The upper GI endoscopy was accomplished without                            difficulty. The patient tolerated the procedure                            well. Scope In: Scope Out: Findings:                 The larynx was normal.                           The esophagus was normal.                           The stomach was normal.                           The cardia and gastric fundus were normal on                            retroflexion.                           The examined duodenum was normal. Complications:            No immediate complications. Estimated Blood Loss:     Estimated blood loss: none. Impression:               - Normal larynx.                           - Normal esophagus.                           - Normal stomach.                           -  Normal examined duodenum.                           - No specimens collected.                           Based on patient description and localization of                            pain (both at recent office visit and on today's                            exam), this is episodic musculoskeletal chest wall                            pain. Recommendation:           - Patient has a contact number available for                            emergencies. The signs and symptoms of potential                            delayed complications were discussed with the                            patient. Return to normal activities tomorrow.                            Written discharge instructions were provided to the                            patient.                           - Resume previous diet.                           - Continue present medications.                           - Return to primary care physician. Carley Glendenning L. Legrand, MD 01/27/2025 2:50:23 PM This report has been signed electronically.

## 2025-01-27 NOTE — Progress Notes (Unsigned)
 Report given to PACU, vss

## 2025-01-27 NOTE — Progress Notes (Unsigned)
1429 Robinul 0.1 mg IV given due large amount of secretions upon assessment.  MD made aware, vss  

## 2025-01-27 NOTE — Progress Notes (Unsigned)
 History and Physical:  This patient presents for endoscopic testing for: Encounter Diagnoses  Name Primary?   RUQ abdominal pain Yes   Heartburn     39 year old woman recently seen in office for about a year and a half of persistent right upper quadrant pain that comes in episodes.  Some elements of the history suggest musculoskeletal pain.  She has had a normal right upper quadrant ultrasound.  Recent CCK HIDA scan showed 82% emptying.  Patient is otherwise without complaints or active issues today.   Past Medical History: Past Medical History:  Diagnosis Date   Allergies    Anemia    Asthma    History of gastroschisis 01/06/2019     Past Surgical History: Past Surgical History:  Procedure Laterality Date   GASTROSCHISIS CLOSURE     TUBAL LIGATION Bilateral 05/24/2019   Procedure: POST PARTUM TUBAL LIGATION;  Surgeon: Barbra Lang PARAS, DO;  Location: [facility] LD ORS;  Service: Gynecology;  Laterality: Bilateral;    Allergies: Allergies[1]  Outpatient Meds: Current Outpatient Medications  Medication Sig Dispense Refill   cetirizine  (ZYRTEC ) 10 MG tablet Take 1 tablet (10 mg total) by mouth daily. 30 tablet 0   gabapentin (NEURONTIN) 300 MG capsule Take 300 mg by mouth daily.     pantoprazole  (PROTONIX ) 40 MG tablet Take 1 tablet (40 mg total) by mouth daily. 30 tablet 3   sucralfate  (CARAFATE ) 1 g tablet Take 1 tablet (1 g total) by mouth 4 (four) times daily -  with meals and at bedtime. 120 tablet 0   albuterol  (PROAIR  HFA) 108 (90 Base) MCG/ACT inhaler Inhale 1-2 puffs into the lungs every 6 (six) hours as needed for wheezing or shortness of breath. 8.5 g 2   meclizine  (ANTIVERT ) 25 MG tablet Take 1 tablet (25 mg total) by mouth 3 (three) times daily as needed for dizziness. 30 tablet 0   Current Facility-Administered Medications  Medication Dose Route Frequency Provider Last Rate Last Admin   0.9 %  sodium chloride  infusion  500 mL Intravenous Once Danis, Dalon Reichart L III, MD           ___________________________________________________________________ Objective   Exam:  BP 121/82   Pulse 65   Temp (!) 97.2 F (36.2 C) (Temporal)   Resp 18   Ht 4' 11 (1.499 m)   Wt 133 lb (60.3 kg)   LMP 01/13/2025 (Exact Date)   SpO2 97%   BMI 26.86 kg/m   CV: regular , S1/S2 Resp: clear to auscultation bilaterally, normal RR and effort noted GI: soft, no tenderness, with active bowel sounds.   Assessment: Encounter Diagnoses  Name Primary?   RUQ abdominal pain Yes   Heartburn      Plan:  EGD***  The benefits and risks of the planned procedure(s) were described in detail with the patient or (when appropriate) their health care proxy.  Risks were outlined as including, but not limited to, bleeding, infection, perforation, adverse medication reaction leading to cardiac or pulmonary decompensation, pancreatitis (if ERCP).  The limitation of incomplete mucosal visualization was also discussed.  No guarantees or warranties were given.  The patient was provided an opportunity to ask questions and all were answered. The patient agreed with the plan.   The patient is appropriate for an endoscopic procedure in the ambulatory setting.   - Victory Brand, MD        [1]  Allergies Allergen Reactions   Other Hives and Rash    [facility]

## 2025-01-31 ENCOUNTER — Telehealth: Payer: Self-pay

## 2025-01-31 NOTE — Telephone Encounter (Signed)
 No answer, left message to call if having any issues or concerns, B.Vester Titsworth RN
